# Patient Record
Sex: Female | Born: 1955 | Race: White | Hispanic: No | Smoking: Never smoker
Health system: Southern US, Community
[De-identification: ages and names within clinical notes are randomized; demographics above are authoritative.]

## PROBLEM LIST (undated history)

## (undated) DIAGNOSIS — K8020 Calculus of gallbladder without cholecystitis without obstruction: Secondary | ICD-10-CM

## (undated) DIAGNOSIS — R112 Nausea with vomiting, unspecified: Secondary | ICD-10-CM

## (undated) DIAGNOSIS — Z9889 Other specified postprocedural states: Secondary | ICD-10-CM

## (undated) DIAGNOSIS — G43909 Migraine, unspecified, not intractable, without status migrainosus: Secondary | ICD-10-CM

## (undated) DIAGNOSIS — N200 Calculus of kidney: Secondary | ICD-10-CM

## (undated) HISTORY — DX: Migraine, unspecified, not intractable, without status migrainosus: G43.909

## (undated) HISTORY — PX: ABDOMINAL HYSTERECTOMY: SHX81

## (undated) HISTORY — PX: APPENDECTOMY: SHX54

---

## 2003-03-04 ENCOUNTER — Encounter: Payer: Self-pay | Admitting: Internal Medicine

## 2003-03-04 ENCOUNTER — Emergency Department (HOSPITAL_COMMUNITY): Admission: EM | Admit: 2003-03-04 | Discharge: 2003-03-04 | Payer: Self-pay | Admitting: Internal Medicine

## 2003-04-29 ENCOUNTER — Encounter: Payer: Self-pay | Admitting: Family Medicine

## 2003-04-29 ENCOUNTER — Ambulatory Visit (HOSPITAL_COMMUNITY): Admission: RE | Admit: 2003-04-29 | Discharge: 2003-04-29 | Payer: Self-pay | Admitting: Family Medicine

## 2003-05-11 ENCOUNTER — Ambulatory Visit (HOSPITAL_COMMUNITY): Admission: RE | Admit: 2003-05-11 | Discharge: 2003-05-11 | Payer: Self-pay | Admitting: Family Medicine

## 2003-05-11 ENCOUNTER — Encounter: Payer: Self-pay | Admitting: Family Medicine

## 2003-05-27 ENCOUNTER — Other Ambulatory Visit: Admission: RE | Admit: 2003-05-27 | Discharge: 2003-05-27 | Payer: Self-pay | Admitting: Obstetrics and Gynecology

## 2003-08-22 ENCOUNTER — Ambulatory Visit (HOSPITAL_COMMUNITY): Admission: RE | Admit: 2003-08-22 | Discharge: 2003-08-22 | Payer: Self-pay | Admitting: Obstetrics & Gynecology

## 2003-08-22 ENCOUNTER — Encounter: Payer: Self-pay | Admitting: Obstetrics & Gynecology

## 2003-09-08 ENCOUNTER — Inpatient Hospital Stay (HOSPITAL_COMMUNITY): Admission: RE | Admit: 2003-09-08 | Discharge: 2003-09-13 | Payer: Self-pay | Admitting: Obstetrics & Gynecology

## 2003-09-10 ENCOUNTER — Encounter: Payer: Self-pay | Admitting: Obstetrics & Gynecology

## 2004-10-11 ENCOUNTER — Encounter (HOSPITAL_COMMUNITY): Admission: RE | Admit: 2004-10-11 | Discharge: 2004-11-10 | Payer: Self-pay | Admitting: Orthopaedic Surgery

## 2005-03-19 ENCOUNTER — Ambulatory Visit (HOSPITAL_COMMUNITY): Admission: RE | Admit: 2005-03-19 | Discharge: 2005-03-19 | Payer: Self-pay | Admitting: Obstetrics & Gynecology

## 2006-03-21 ENCOUNTER — Ambulatory Visit (HOSPITAL_COMMUNITY): Admission: RE | Admit: 2006-03-21 | Discharge: 2006-03-21 | Payer: Self-pay | Admitting: Obstetrics & Gynecology

## 2007-06-04 ENCOUNTER — Ambulatory Visit (HOSPITAL_COMMUNITY): Admission: RE | Admit: 2007-06-04 | Discharge: 2007-06-04 | Payer: Self-pay | Admitting: Family Medicine

## 2007-07-13 ENCOUNTER — Ambulatory Visit (HOSPITAL_COMMUNITY): Admission: RE | Admit: 2007-07-13 | Discharge: 2007-07-13 | Payer: Self-pay | Admitting: Internal Medicine

## 2007-07-13 ENCOUNTER — Ambulatory Visit: Payer: Self-pay | Admitting: Internal Medicine

## 2007-09-15 ENCOUNTER — Emergency Department (HOSPITAL_COMMUNITY): Admission: EM | Admit: 2007-09-15 | Discharge: 2007-09-15 | Payer: Self-pay | Admitting: Emergency Medicine

## 2007-10-06 ENCOUNTER — Emergency Department (HOSPITAL_COMMUNITY): Admission: EM | Admit: 2007-10-06 | Discharge: 2007-10-06 | Payer: Self-pay | Admitting: Emergency Medicine

## 2007-10-30 ENCOUNTER — Ambulatory Visit (HOSPITAL_COMMUNITY): Admission: RE | Admit: 2007-10-30 | Discharge: 2007-10-30 | Payer: Self-pay | Admitting: Family Medicine

## 2011-01-21 ENCOUNTER — Other Ambulatory Visit (HOSPITAL_COMMUNITY): Payer: Self-pay | Admitting: Family Medicine

## 2011-01-21 DIAGNOSIS — Z139 Encounter for screening, unspecified: Secondary | ICD-10-CM

## 2011-01-28 ENCOUNTER — Ambulatory Visit (HOSPITAL_COMMUNITY): Payer: Self-pay

## 2011-01-28 ENCOUNTER — Ambulatory Visit (HOSPITAL_COMMUNITY)
Admission: RE | Admit: 2011-01-28 | Discharge: 2011-01-28 | Disposition: A | Discharge status: Home / Self Care | Payer: Self-pay | Source: Ambulatory Visit | Attending: Family Medicine | Admitting: Family Medicine

## 2011-01-28 DIAGNOSIS — Z1231 Encounter for screening mammogram for malignant neoplasm of breast: Secondary | ICD-10-CM | POA: Insufficient documentation

## 2011-01-28 DIAGNOSIS — Z139 Encounter for screening, unspecified: Secondary | ICD-10-CM

## 2011-01-30 ENCOUNTER — Other Ambulatory Visit: Payer: Self-pay | Admitting: Family Medicine

## 2011-01-30 DIAGNOSIS — R928 Other abnormal and inconclusive findings on diagnostic imaging of breast: Secondary | ICD-10-CM

## 2011-02-06 ENCOUNTER — Ambulatory Visit (HOSPITAL_COMMUNITY)
Admission: RE | Admit: 2011-02-06 | Discharge: 2011-02-06 | Disposition: A | Discharge status: Home / Self Care | Payer: Self-pay | Source: Ambulatory Visit | Attending: Family Medicine | Admitting: Family Medicine

## 2011-02-06 ENCOUNTER — Other Ambulatory Visit: Payer: Self-pay

## 2011-02-06 DIAGNOSIS — N6009 Solitary cyst of unspecified breast: Secondary | ICD-10-CM | POA: Insufficient documentation

## 2011-02-06 DIAGNOSIS — R928 Other abnormal and inconclusive findings on diagnostic imaging of breast: Secondary | ICD-10-CM | POA: Insufficient documentation

## 2011-04-23 NOTE — Op Note (Signed)
NAME:  Andrea Miles, Andrea Miles               ACCOUNT NO.:  [id number]   MEDICAL RECORD NO.:  [id number]          PATIENT TYPE:  AMB   LOCATION:  DAY                           FACILITY:  [facility]   PHYSICIAN:  R. Roetta Sessions, M.D. DATE OF BIRTH:  1956-01-10   DATE OF PROCEDURE:  07/13/2007  DATE OF DISCHARGE:                               OPERATIVE REPORT   PROCEDURE:  Screening colonoscopy.   INDICATIONS FOR PROCEDURE:  55 year old lady referred by Dr. Lubertha South for colorectal cancer screening.  She has no lower GI tract  symptoms.  She has never had her lower GI tract imaged.  There is no  family history of colorectal neoplasia.  Colonoscopy is now being done.  This approach has been discussed with the patient at length.  The  potential risks, benefits, and alternatives have been reviewed and  questions answered.  She is agreeable.  Please see the documentation in  the medical record.   PROCEDURE:  O2 saturation, blood pressure, pulses, and respirations were  monitored throughout the entirety of the procedure.  Conscious sedation  was with Versed 5 mg IV, Demerol 125 mg IV in divided doses.  The  instrument was the Pentax video chip system.   FINDINGS:  Digital rectal examination revealed no abnormalities.   ENDOSCOPIC FINDINGS:  The prep was good.   Examination of the colonic mucosa was undertaken from the rectosigmoid  junction through the left, transverse, right colon to the area of the  appendiceal orifice, ileocecal valve, and cecum.  These structures were  well-seen and photographed for the record.  From this level, the scope  was slowly withdrawn.  All previously mentioned mucosal surfaces were  again seen.  The colonic mucosa appeared normal.  The scope was put down  into the rectum where a thorough examination of the rectal mucosa  including retroflex view of the anal verge demonstrates no  abnormalities.   THERAPEUTIC/DIAGNOSTIC MANEUVERS:  None.   The patient tolerated  the procedure well and was reactive in endoscopy.   IMPRESSION:  1. Normal rectum.  2. Normal colon.   RECOMMENDATIONS:  Repeat screening colonoscopy in 10 years.      Jonathon Bellows, M.D.  Electronically Signed     RMR/MEDQ  D:  07/13/2007  T:  07/13/2007  Job:  [id number]   cc:   Donna Bernard, M.D.  Fax: [phone]

## 2011-04-26 NOTE — Op Note (Signed)
NAME:  Andrea Miles, Andrea Miles                         ACCOUNT NO.:  [id number]   MEDICAL RECORD NO.:  [id number]                   PATIENT TYPE:  AMB   LOCATION:  DAY                                  FACILITY:  [facility]   PHYSICIAN:  Lazaro Arms, M.D.                DATE OF BIRTH:  10/17/1956   DATE OF PROCEDURE:  09/08/2003  DATE OF DISCHARGE:                                 OPERATIVE REPORT   PREOPERATIVE DIAGNOSES:  1. Hypomenorrhea.  2. Dysmenorrhea.  3. Dyspareunia.  4. Anemia.  5. Chronic pelvic pain.   POSTOPERATIVE DIAGNOSES:  1. Hypomenorrhea.  2. Dysmenorrhea.  3. Dyspareunia.  4. Anemia.  5. Chronic pelvic pain.   OPERATION/PROCEDURE:  Total abdominal hysterectomy/bilateral salpingo-  oophorectomy.   SURGEON:  Lazaro Arms, M.D.   ANESTHESIA:  General endotracheal anesthesia.   FINDINGS:  The patient had globular enlarged uterus.  There were no  adhesions.  There was no endometriosis.  No discernable fibroids, probably  consistent with adenomyosis.   DESCRIPTION OF PROCEDURE:  The patient was taken to the operating room and  placed in the supine position where she underwent general endotracheal  anesthesia.  The vagina was prepped, the Foley catheter was placed.  The  abdomen was prepped and draped in the usual sterile fashion.   A Pfannenstiel skin incision was made and carried down sharply to the rectus  fascia which was scored in the midline and extended laterally.  The fascia  was taken off the muscle superiorly and inferiorly and the muscles were  divided.  The peritoneal cavity was entered.  A Protractor self-retaining  retractor was placed.  The uterine cornua were grasped.  The  infundibulopelvic ligaments were isolated, an avascular window made.  They  were clamped, cut and doubly suture ligated.  The round ligaments were  suture ligated and cut bilaterally as well.  The uterine vessels were  skeletonized.  The vesicouterine serosa flap was created and  the bladder was  pushed off the lower uterine segment without difficulty.  The uterine  vessels were clamped, cut and suture ligated without difficulty.  Serial  pedicles were taken down the cervix, each pedicle being clamped, cut,  transfixed and suture ligated.  The vaginal angles were crossclamped.  Specimen was removed.  Vaginal sutures were placed and vagina was closed  with interrupted figure-of-eight sutures.  There was good hemostasis.  Pelvis was irrigated vigorously.  All pedicles were found to be hemostatic.  Lap packs and retractors were removed.  All counts were correct.  The  muscles were reapproximated loosely.  The fascia closed using 0 Vicryl  running.  Subcutaneous tissue was made hemostatic and irrigated.  The skin  was closed using skin staples.  The patient tolerated the procedure well.  She experienced 150 mL of blood loss and was taken to the recovery room in  good and stable condition.  All  counts were correct x3.  She received Ancef  prophylactically.       ___________________________________________                                            Lazaro Arms, M.D.   Loraine Maple  D:  09/08/2003  T:  09/08/2003  Job:  [id number]

## 2011-04-26 NOTE — H&P (Signed)
   NAME:  Andrea Miles, Andrea Miles                         ACCOUNT NO.:  [id number]   MEDICAL RECORD NO.:  [id number]                   PATIENT TYPE:  AMB   LOCATION:  DAY                                  FACILITY:  [facility]   PHYSICIAN:  Lazaro Arms, M.D.                DATE OF BIRTH:  29-May-1956   DATE OF ADMISSION:  DATE OF DISCHARGE:                                HISTORY & PHYSICAL   HISTORY OF PRESENT ILLNESS:  Andrea Miles is a 55 year old white female, gravida 3,  para 2, abortus 1, with last menstrual period of June 25, 2003, who has been  maintained on Megace to prevent her from having any more cycles. The patient  has very heavy menstrual cycles and extremely crampy. She has pain with  intercourse in both the midline and both adnexal areas with each time. Her  hemoglobin in the office was 10.5 despite daily iron therapy. As a result,  she is requesting definitively therapy. She at one time was talking about  discussing the possibility of being pregnant in the future, but she has now  dismissed that. As a result, we are proceeding with a TAH/BSO.   PAST MEDICAL HISTORY:  Negative.   PAST SURGICAL HISTORY:  Appendectomy in 1966, tubal ligation in 1981,  reversal in 1986.   ALLERGIES:  CODEINE, SULFA, VICODIN, MOTRIN, LODINE, DARVOCET, PERCOCET, and  ELAVIL.   MEDICATIONS:  The only medication she currently takes is Xanax and Paxil.   REVIEW OF SYSTEMS:  Negative. She does not smoke, drink, or do drugs  currently.   PHYSICAL EXAMINATION:  HEENT:  Unremarkable.  NECK:  Thyroid was normal.  LUNGS:  Lungs were clear.  HEART:  Heart is regular rate and rhythm without gallop.  BREASTS:  Breasts without masses, discharge, or skin changes.  ABDOMEN:  Abdomen is benign. No hepatosplenomegaly or masses.  GU:  She has normal external genitalia. Vagina is  __________ without  discharge.  Cervix is parous without  lesions. Uterus normal shape and  contour. Ovaries normal and nontender.   IMPRESSION:  1. Hypermenorrhea.  2. Dysmenorrhea.  3. Dyspareunia.    PLAN:  The patient is admitted for TAH/BSO. She understands the risks,  benefits, indications, and alternatives and will proceed.    ___________________________________________                                         Lazaro Arms, M.D.   Andrea Miles  D:  09/07/2003  T:  09/07/2003  Job:  [id number]

## 2013-08-08 ENCOUNTER — Encounter (HOSPITAL_COMMUNITY): Payer: Self-pay | Admitting: *Deleted

## 2013-08-08 ENCOUNTER — Emergency Department (HOSPITAL_COMMUNITY)
Admission: EM | Admit: 2013-08-08 | Discharge: 2013-08-08 | Disposition: A | Discharge status: Home / Self Care | Payer: Self-pay | Attending: Emergency Medicine | Admitting: Emergency Medicine

## 2013-08-08 DIAGNOSIS — Z8719 Personal history of other diseases of the digestive system: Secondary | ICD-10-CM | POA: Insufficient documentation

## 2013-08-08 DIAGNOSIS — R1011 Right upper quadrant pain: Secondary | ICD-10-CM | POA: Insufficient documentation

## 2013-08-08 DIAGNOSIS — Z87442 Personal history of urinary calculi: Secondary | ICD-10-CM | POA: Insufficient documentation

## 2013-08-08 DIAGNOSIS — R109 Unspecified abdominal pain: Secondary | ICD-10-CM

## 2013-08-08 HISTORY — DX: Calculus of kidney: N20.0

## 2013-08-08 HISTORY — DX: Calculus of gallbladder without cholecystitis without obstruction: K80.20

## 2013-08-08 LAB — CBC WITH DIFFERENTIAL/PLATELET
Basophils Relative: 0 % (ref 0–1)
Lymphocytes Relative: 17 % (ref 12–46)
Lymphs Abs: 2.2 10*3/uL (ref 0.7–4.0)
Neutrophils Relative %: 78 % — ABNORMAL HIGH (ref 43–77)
RDW: 12.3 % (ref 11.5–15.5)

## 2013-08-08 LAB — COMPREHENSIVE METABOLIC PANEL
Albumin: 4.1 g/dL (ref 3.5–5.2)
CO2: 26 mEq/L (ref 19–32)
Chloride: 99 mEq/L (ref 96–112)
Creatinine, Ser: 0.81 mg/dL (ref 0.50–1.10)
GFR calc Af Amer: >90 mL/min (ref >90)
GFR calc non Af Amer: 79 mL/min — ABNORMAL LOW (ref >90)
Glucose, Bld: 96 mg/dL (ref 70–99)
Potassium: 3.7 mEq/L (ref 3.5–5.1)
Sodium: 135 mEq/L (ref 135–145)
Total Bilirubin: 0.4 mg/dL (ref 0.3–1.2)
Total Protein: 7.9 g/dL (ref 6.0–8.3)

## 2013-08-08 LAB — LIPASE, BLOOD: Lipase: 23 U/L (ref 11–59)

## 2013-08-08 MED ORDER — PROMETHAZINE HCL 25 MG PO TABS: 25.0000 mg | ORAL_TABLET | Freq: Four times a day (QID) | ORAL | Status: DC | PRN

## 2013-08-08 MED ORDER — TRAMADOL HCL 50 MG PO TABS: 50.0000 mg | ORAL_TABLET | Freq: Four times a day (QID) | ORAL | Status: DC | PRN

## 2013-08-08 MED ORDER — MORPHINE SULFATE 4 MG/ML IJ SOLN
4.0000 mg | Freq: Once | INTRAMUSCULAR | Status: AC
  Administered 2013-08-08: 4 mg via INTRAVENOUS
  Filled 2013-08-08: qty 1

## 2013-08-08 MED ORDER — ONDANSETRON HCL 4 MG/2ML IJ SOLN
4.0000 mg | Freq: Once | INTRAMUSCULAR | Status: AC
  Administered 2013-08-08: 4 mg via INTRAVENOUS
  Filled 2013-08-08: qty 2

## 2013-08-08 MED ORDER — SODIUM CHLORIDE 0.9 % IV BOLUS (SEPSIS)
1000.0000 mL | Freq: Once | INTRAVENOUS | Status: AC
  Administered 2013-08-08: 1000 mL via INTRAVENOUS

## 2013-08-08 NOTE — ED Notes (Signed)
Pt co pain 8/10

## 2013-08-08 NOTE — ED Notes (Signed)
Pt c/o right upper quad pain that radiates around to back area, has hx of gallstones, denies any n/v,

## 2013-08-08 NOTE — ED Notes (Signed)
Pt alert & oriented x4, stable gait. Patient given discharge instructions, paperwork & prescription(s). Patient  instructed to stop at the registration desk to finish any additional paperwork. Patient verbalized understanding. Pt left department w/ no further questions. 

## 2013-08-08 NOTE — ED Notes (Signed)
Pt medicated for pain 8/10

## 2013-08-08 NOTE — ED Notes (Signed)
Per Dr. Adriana Simas - patient given a Sprite.

## 2013-08-08 NOTE — ED Provider Notes (Signed)
CSN: [id number]     Arrival date & time 08/08/13  1117 History  This chart was scribed for Andrea Hutching, MD by Caryn Bee, ED Scribe and Greggory Stallion, ED Scribe. This patient was seen in room APA03/APA03 and the patient's care was started at 1:09 PM.    Chief Complaint  Patient presents with  . Abdominal Pain    The history is provided by the patient. No language interpreter was used.   HPI Comments: Andrea Miles is a 57 y.o. female who presents to the Emergency Department complaining of RUQ abdominal pain that radiates to the right side of her back that began at 10:00 PM last night. Pt had and ultrasound about 4 or 5 years ago done at [facility] and was diagnosed with gallstones. Pt has tried tylenol but has had no relief. Pt has no appetite. No nausea, vomiting, diarrhea  PCP: Dr. Lilyan Punt    Past Medical History  Diagnosis Date  . Gallstones   . Kidney stones    Past Surgical History  Procedure Laterality Date  . Abdominal hysterectomy    . Appendectomy     No family history on file. History  Substance Use Topics  . Smoking status: Never Smoker   . Smokeless tobacco: Not on file  . Alcohol Use: No   OB History   Grav Para Term Preterm Abortions TAB SAB Ect Mult Living                 Review of Systems  All other systems reviewed and are negative.   A complete 10 system review of systems was obtained and all systems are negative except as noted in the HPI and PMH.    Allergies  Amitriptyline; Codeine; Darvocet; Oxycodone; Sulfa antibiotics; and Toradol  Home Medications  No current outpatient prescriptions on file. BP 148/86  Pulse 88  Temp(Src) 98.5 F (36.9 C)  Resp 18  Ht 5\' 7"  (1.702 m)  Wt 170 lb (77.111 kg)  BMI 26.62 kg/m2  SpO2 98% Physical Exam  Nursing note and vitals reviewed. Constitutional: She is oriented to person, place, and time. She appears well-developed and well-nourished.  HENT:  Head: Normocephalic and atraumatic.   Eyes: Conjunctivae and EOM are normal. Pupils are equal, round, and reactive to light.  Neck: Normal range of motion. Neck supple.  Cardiovascular: Normal rate, regular rhythm and normal heart sounds.   Pulmonary/Chest: Effort normal and breath sounds normal.  Abdominal: Soft. Bowel sounds are normal. There is tenderness.  Tenderness to RUQ  Musculoskeletal: Normal range of motion.  Neurological: She is alert and oriented to person, place, and time.  Skin: Skin is warm and dry.  Psychiatric: She has a normal mood and affect.    ED Course  Procedures (including critical care time) DIAGNOSTIC STUDIES: Oxygen Saturation is 98% on room air, normal by my interpretation.    COORDINATION OF CARE: 1:54 PM-Discussed treatment plan which includes pain medications, blood tests, and outpatient ultrasound with pt at bedside and pt agreed to plan.     Labs Review Labs Reviewed  COMPREHENSIVE METABOLIC PANEL - Abnormal; Notable for the following:    GFR calc non Af Amer 79 (*)    All other components within normal limits  CBC WITH DIFFERENTIAL - Abnormal; Notable for the following:    WBC 13.6 (*)    Neutrophils Relative % 78 (*)    Neutro Abs 10.6 (*)    All other components within normal limits  LIPASE,  BLOOD   Imaging Review No results found.  MDM  No diagnosis found. No acute abdomen. White count noted to be slightly elevated. Liver enzymes negative. Gallbladder ultrasound scheduled for tomorrow. Discharge meds Ultram and Phenergan 25 mg     I personally performed the services described in this documentation, which was scribed in my presence. The recorded information has been reviewed and is accurate.    Andrea Hutching, MD 08/08/13 1745

## 2013-08-09 ENCOUNTER — Ambulatory Visit (HOSPITAL_COMMUNITY)
Admit: 2013-08-09 | Discharge: 2013-08-09 | Disposition: A | Discharge status: Home / Self Care | Payer: Self-pay | Attending: Emergency Medicine | Admitting: Emergency Medicine

## 2013-08-09 DIAGNOSIS — R1011 Right upper quadrant pain: Secondary | ICD-10-CM | POA: Insufficient documentation

## 2013-08-09 DIAGNOSIS — K7689 Other specified diseases of liver: Secondary | ICD-10-CM | POA: Insufficient documentation

## 2013-08-09 DIAGNOSIS — K8020 Calculus of gallbladder without cholecystitis without obstruction: Secondary | ICD-10-CM | POA: Insufficient documentation

## 2014-07-11 ENCOUNTER — Encounter: Payer: Self-pay | Admitting: Nurse Practitioner

## 2014-07-27 ENCOUNTER — Ambulatory Visit (INDEPENDENT_AMBULATORY_CARE_PROVIDER_SITE_OTHER): Payer: Self-pay | Admitting: Nurse Practitioner

## 2014-07-27 ENCOUNTER — Encounter: Payer: Self-pay | Admitting: Nurse Practitioner

## 2014-07-27 ENCOUNTER — Other Ambulatory Visit: Payer: Self-pay | Admitting: Nurse Practitioner

## 2014-07-27 VITALS — BP 134/86 | Temp 98.6°F | Ht 68.0 in | Wt 209.0 lb

## 2014-07-27 DIAGNOSIS — Z01419 Encounter for gynecological examination (general) (routine) without abnormal findings: Secondary | ICD-10-CM

## 2014-07-27 DIAGNOSIS — Z0000 Encounter for general adult medical examination without abnormal findings: Secondary | ICD-10-CM

## 2014-07-27 DIAGNOSIS — E2839 Other primary ovarian failure: Secondary | ICD-10-CM

## 2014-07-27 DIAGNOSIS — R300 Dysuria: Secondary | ICD-10-CM

## 2014-07-27 DIAGNOSIS — Z1231 Encounter for screening mammogram for malignant neoplasm of breast: Secondary | ICD-10-CM

## 2014-07-27 LAB — POCT URINALYSIS DIPSTICK
Spec Grav, UA: 1.015
pH, UA: 6

## 2014-07-27 MED ORDER — ESTRADIOL 1 MG PO TABS: 1.0000 mg | ORAL_TABLET | Freq: Every day | ORAL | Status: DC

## 2014-07-27 MED ORDER — HYDROXYZINE HCL 50 MG PO TABS: 50.0000 mg | ORAL_TABLET | Freq: Three times a day (TID) | ORAL | Status: DC | PRN

## 2014-07-27 MED ORDER — PHENTERMINE HCL 37.5 MG PO TABS
37.5000 mg | ORAL_TABLET | Freq: Every day | ORAL | Status: DC
Start: 2014-07-27 — End: 2014-10-28

## 2014-07-29 ENCOUNTER — Encounter: Payer: Self-pay | Admitting: Nurse Practitioner

## 2014-07-29 DIAGNOSIS — E2839 Other primary ovarian failure: Secondary | ICD-10-CM | POA: Insufficient documentation

## 2014-07-29 LAB — URINE CULTURE
COLONY COUNT: NO GROWTH
Organism ID, Bacteria: NO GROWTH

## 2014-07-29 NOTE — Progress Notes (Signed)
Subjective:    Patient ID: Andrea Miles, female    DOB: 15-Jul-1956, 58 y.o.   MRN: [id number]  Urinary Tract Infection  Pertinent negatives include no frequency, nausea, urgency or vomiting.   presents for her wellness physical. Has had a hysterectomy and bilateral oophorectomy. External burning with urination. Minimal urgency and frequency. No vaginal discharge. No fever. No pelvic pain. Is with the same partner but has not had intercourse in about 6-7 years. Would like to restart her phentermine, has been off this for several years. Has taken it without difficulty in the past. Complaints of significant frequent hot flashes, no relief with OTC or natural supplements. Healthy diet. Active. Nonsmoker. No personal history of blood clots. Regular vision and dental exams.    Review of Systems  Constitutional: Negative for fever, activity change, appetite change and fatigue.  HENT: Positive for rhinorrhea. Negative for dental problem, ear pain, sinus pressure and sore throat.   Respiratory: Negative for cough, chest tightness, shortness of breath and wheezing.   Cardiovascular: Negative for chest pain and leg swelling.  Gastrointestinal: Negative for nausea, vomiting, abdominal pain, diarrhea, constipation, blood in stool and abdominal distention.  Genitourinary: Positive for dysuria. Negative for urgency, frequency, vaginal discharge, enuresis, difficulty urinating, genital sores and pelvic pain.       Objective:   Physical Exam  Constitutional: She is oriented to person, place, and time. She appears well-developed. No distress.  HENT:  Right Ear: External ear normal.  Left Ear: External ear normal.  Mouth/Throat: Oropharynx is clear and moist.  Neck: Normal range of motion. Neck supple. No tracheal deviation present. No thyromegaly present.  Cardiovascular: Normal rate, regular rhythm and normal heart sounds.  Exam reveals no gallop.   No murmur heard. Pulmonary/Chest: Effort normal  and breath sounds normal.  Abdominal: Soft. She exhibits no distension. There is no tenderness.  Genitourinary: No vaginal discharge found.  External GU: No rashes or lesions, irritation. Skin is pale and dry. Vagina tissue is pale. No discharge. Rectal exam no masses noted, no stool for Hemoccult.  Musculoskeletal: She exhibits no edema.  Lymphadenopathy:    She has no cervical adenopathy.  Neurological: She is alert and oriented to person, place, and time.  Skin: Skin is warm and dry. No rash noted.  Psychiatric: She has a normal mood and affect. Her behavior is normal.   breast exam: No masses noted. Axilla no adenopathy. UA negative.       Assessment & Plan:  Well woman exam - Plan: MM DIGITAL SCREENING BILATERAL  Dysuria - Plan: POCT urinalysis dipstick, Urine culture  Hypoestrogenism  Morbid obesity  Meds ordered this encounter  Medications  . hydrOXYzine (ATARAX/VISTARIL) 50 MG tablet    Sig: Take 1 tablet (50 mg total) by mouth 3 (three) times daily as needed for itching.    Dispense:  90 tablet    Refill:  1    Order Specific Question:  Supervising Provider    Answer:  Mikey Kirschner [[id number]]  . phentermine (ADIPEX-P) 37.5 MG tablet    Sig: Take 1 tablet (37.5 mg total) by mouth daily before breakfast.    Dispense:  30 tablet    Refill:  2    Order Specific Question:  Supervising Provider    Answer:  Mikey Kirschner [[id number]]  . estradiol (ESTRACE) 1 MG tablet    Sig: Take 1 tablet (1 mg total) by mouth daily.    Dispense:  90 tablet    Refill:  1    Order Specific Question:  Supervising Provider    Answer:  Maggie Font   Encourage regular activity, healthy diet and daily vitamin D and calcium supplementation. Recheck in 3 months if she wishes to continue phentermine. Discussed risk associated with estrogen use at length. Patient is uninsured, plan routine lab work at next visit. Also recommend tetanus booster. Next physical in one year.

## 2014-07-30 ENCOUNTER — Encounter: Payer: Self-pay | Admitting: *Deleted

## 2014-08-01 ENCOUNTER — Ambulatory Visit (HOSPITAL_COMMUNITY): Payer: Self-pay

## 2014-08-03 ENCOUNTER — Ambulatory Visit (HOSPITAL_COMMUNITY)
Admission: RE | Admit: 2014-08-03 | Discharge: 2014-08-03 | Disposition: A | Discharge status: Home / Self Care | Payer: Self-pay | Source: Ambulatory Visit | Attending: Nurse Practitioner | Admitting: Nurse Practitioner

## 2014-08-03 DIAGNOSIS — Z1231 Encounter for screening mammogram for malignant neoplasm of breast: Secondary | ICD-10-CM | POA: Insufficient documentation

## 2014-10-28 ENCOUNTER — Ambulatory Visit (INDEPENDENT_AMBULATORY_CARE_PROVIDER_SITE_OTHER): Payer: Self-pay | Admitting: Nurse Practitioner

## 2014-10-28 ENCOUNTER — Encounter: Payer: Self-pay | Admitting: Nurse Practitioner

## 2014-10-28 DIAGNOSIS — E2839 Other primary ovarian failure: Secondary | ICD-10-CM

## 2014-10-28 MED ORDER — PHENTERMINE HCL 37.5 MG PO TABS: 37.5000 mg | ORAL_TABLET | Freq: Every day | ORAL | Status: DC

## 2014-10-28 MED ORDER — ESTRADIOL 1 MG PO TABS: 1.0000 mg | ORAL_TABLET | Freq: Every day | ORAL | Status: DC

## 2014-11-01 ENCOUNTER — Encounter: Payer: Self-pay | Admitting: Nurse Practitioner

## 2014-11-01 NOTE — Progress Notes (Signed)
Subjective:  Presents for recheck. Active. Healthy diet. No side effects with phentermine. Also wants to continue Estrace. Understands risks associated with HRT.   Objective:   BP 130/84 mmHg  Ht 5\' 8"  (1.727 m)  Wt 196 lb 8 oz (89.132 kg)  BMI 29.88 kg/m2 NAD. Alert, oriented. Lungs clear. Heart RRR.   Assessment:  Problem List Items Addressed This Visit      Other   Morbid obesity - Primary   Relevant Medications      phentermine (ADIPEX-P) 37.5 MG tablet   Hypoestrogenism     Plan:  Meds ordered this encounter  Medications  . phentermine (ADIPEX-P) 37.5 MG tablet    Sig: Take 1 tablet (37.5 mg total) by mouth daily before breakfast.    Dispense:  30 tablet    Refill:  2    Order Specific Question:  Supervising Provider    Answer:  Mikey Kirschner [[id number]]  . estradiol (ESTRACE) 1 MG tablet    Sig: Take 1 tablet (1 mg total) by mouth daily.    Dispense:  90 tablet    Refill:  1    Order Specific Question:  Supervising Provider    Answer:  Maggie Font   Will need to stop Phentermine after this prescription. Again reviewed risks associated with HRT. Recheck here as needed.

## 2015-01-30 ENCOUNTER — Other Ambulatory Visit: Payer: Self-pay | Admitting: Nurse Practitioner

## 2015-01-30 MED ORDER — ESTRADIOL 1 MG PO TABS: 1.0000 mg | ORAL_TABLET | Freq: Every day | ORAL | Status: DC

## 2016-03-18 ENCOUNTER — Other Ambulatory Visit: Payer: Self-pay | Admitting: Nurse Practitioner

## 2016-03-18 MED ORDER — DIAZEPAM 10 MG PO TABS: 10.0000 mg | ORAL_TABLET | Freq: Every evening | ORAL | Status: DC | PRN

## 2016-03-18 NOTE — Progress Notes (Signed)
Patient's mother died yesterday after prolonged illness and hospice care. Not able to sleep. Given one Rx for valium which has worked in the past.

## 2017-06-09 ENCOUNTER — Telehealth: Payer: Self-pay

## 2017-06-09 NOTE — Telephone Encounter (Signed)
Letter mailed to pt.  

## 2017-06-09 NOTE — Telephone Encounter (Signed)
RECALL FOR TCS °

## 2018-06-25 ENCOUNTER — Encounter: Payer: Self-pay | Admitting: Nurse Practitioner

## 2018-06-25 ENCOUNTER — Ambulatory Visit (INDEPENDENT_AMBULATORY_CARE_PROVIDER_SITE_OTHER): Payer: Self-pay | Admitting: Nurse Practitioner

## 2018-06-25 VITALS — BP 138/86 | Ht 68.0 in | Wt 211.2 lb

## 2018-06-25 DIAGNOSIS — Z01419 Encounter for gynecological examination (general) (routine) without abnormal findings: Secondary | ICD-10-CM

## 2018-06-25 DIAGNOSIS — F5104 Psychophysiologic insomnia: Secondary | ICD-10-CM

## 2018-06-25 MED ORDER — HYDROXYZINE HCL 50 MG PO TABS: 50.0000 mg | ORAL_TABLET | Freq: Three times a day (TID) | ORAL | 1 refills | Status: DC | PRN

## 2018-06-25 MED ORDER — DIAZEPAM 10 MG PO TABS: 10.0000 mg | ORAL_TABLET | Freq: Every evening | ORAL | 1 refills | Status: DC | PRN

## 2018-06-25 NOTE — Patient Instructions (Addendum)
[facility] [phone]

## 2018-06-27 ENCOUNTER — Encounter: Payer: Self-pay | Admitting: Nurse Practitioner

## 2018-06-27 DIAGNOSIS — F5104 Psychophysiologic insomnia: Secondary | ICD-10-CM | POA: Insufficient documentation

## 2018-06-27 NOTE — Progress Notes (Signed)
Subjective:    Patient ID: Andrea Miles, female    DOB: [birth year], 62 y.o.   MRN: [id number]  HPI presents for her wellness exam. BP outside of office running about 130/80. Has had a total hysterectomy. Same sexual partner. Has eye exam scheduled. Regular dental care. Takes valium 10 mg about every 3 nights for sleep. Regular walking for activity. Defers labs and colonoscopy due to lack of insurance.     Review of Systems  Constitutional: Negative for activity change, appetite change and fatigue.  HENT: Negative for dental problem, ear pain, sinus pressure and sore throat.   Respiratory: Negative for cough, chest tightness, shortness of breath and wheezing.   Cardiovascular: Negative for chest pain.  Gastrointestinal: Negative for abdominal distention, abdominal pain, blood in stool, constipation, diarrhea, nausea and vomiting.  Genitourinary: Negative for difficulty urinating, dysuria, enuresis, frequency, genital sores, pelvic pain, urgency and vaginal discharge.   Depression screen PHQ 2/9 06/25/2018  Decreased Interest 0  Down, Depressed, Hopeless 0  PHQ - 2 Score 0   GAD 7 : Generalized Anxiety Score 06/25/2018  Nervous, Anxious, on Edge 3  Control/stop worrying 3  Worry too much - different things 3  Trouble relaxing 3  Restless 2  Easily annoyed or irritable 3  Afraid - awful might happen 1  Total GAD 7 Score 18  Anxiety Difficulty Somewhat difficult    Her boyfriend has multiple health issues which she states has caused her anxiety and stress.      Objective:   Physical Exam  Constitutional: She is oriented to person, place, and time. She appears well-developed. No distress.  HENT:  Right Ear: External ear normal.  Left Ear: External ear normal.  Mouth/Throat: Oropharynx is clear and moist.  Neck: Normal range of motion. Neck supple. No tracheal deviation present. No thyromegaly present.  Cardiovascular: Normal rate, regular rhythm and normal heart sounds. Exam  reveals no gallop.  No murmur heard. Pulmonary/Chest: Effort normal and breath sounds normal. Right breast exhibits no inverted nipple, no mass, no skin change and no tenderness. Left breast exhibits no inverted nipple, no mass, no skin change and no tenderness. Breasts are symmetrical.  Axillae no adenopathy.   Abdominal: Soft. She exhibits no distension. There is no tenderness.  Genitourinary: Vagina normal. No vaginal discharge found.  Genitourinary Comments: External GU: no rashes or lesions. Vagina: no discharge. Bimanual exam: no tenderness or obvious masses.   Musculoskeletal: She exhibits no edema.  Lymphadenopathy:    She has no cervical adenopathy.  Neurological: She is alert and oriented to person, place, and time.  Skin: Skin is warm and dry. No rash noted.  Psychiatric: She has a normal mood and affect. Her behavior is normal.          Assessment & Plan:   Problem List Items Addressed This Visit      Other   Psychophysiological insomnia    Other Visit Diagnoses    Well woman exam    -  Primary     Meds ordered this encounter  Medications  . hydrOXYzine (ATARAX/VISTARIL) 50 MG tablet    Sig: Take 1 tablet (50 mg total) by mouth 3 (three) times daily as needed for itching.    Dispense:  90 tablet    Refill:  1    Order Specific Question:   Supervising Provider    Answer:   Mikey Kirschner [[id number]]  . diazepam (VALIUM) 10 MG tablet    Sig: Take 1 tablet (  10 mg total) by mouth at bedtime as needed for sleep.    Dispense:  30 tablet    Refill:  1    Order Specific Question:   Supervising Provider    Answer:   Mikey Kirschner [[id number]]   Given refill on hydroxyzine. Take valium no more than every 3 days for sleep. Defers daily medicine at this point for anxiety.  Given Rx to get mammogram at [facility] for $99. Call back if she wishes further treatment for anxiety.  Return in about 1 year (around 06/26/2019) for physical.

## 2018-11-23 ENCOUNTER — Telehealth: Payer: Self-pay | Admitting: Family Medicine

## 2018-11-23 NOTE — Telephone Encounter (Signed)
Pt has been summoned for Juror Duty and is needing a note to dismiss her from having to attend due to her being POA and caregiver for mutual pt of [place],Michael dob: [birth year]. Advise.

## 2018-11-23 NOTE — Telephone Encounter (Signed)
Please advise. Thank you

## 2018-11-26 ENCOUNTER — Encounter: Payer: Self-pay | Admitting: Family Medicine

## 2018-11-26 NOTE — Telephone Encounter (Signed)
Mailed out letter.

## 2018-11-26 NOTE — Telephone Encounter (Signed)
Letter was dictated thank you please forward to the patient thank you

## 2019-02-17 ENCOUNTER — Telehealth: Payer: Self-pay | Admitting: Family Medicine

## 2019-02-17 MED ORDER — DIAZEPAM 10 MG PO TABS: 10.0000 mg | ORAL_TABLET | Freq: Every evening | ORAL | 3 refills | Status: DC | PRN

## 2019-02-17 NOTE — Telephone Encounter (Signed)
Script printed and awaiting signature. Will call to pharmacy when signed. Pt contacted and verbalized understanding.

## 2019-02-17 NOTE — Telephone Encounter (Signed)
Has a physical scheduled for 06/28/19 and would like a refill on Diazepam to last until them.  [facility] in [place]

## 2019-02-17 NOTE — Telephone Encounter (Signed)
May have 30 with 3 refills

## 2019-02-17 NOTE — Telephone Encounter (Signed)
Please advise. Thank you

## 2019-06-28 ENCOUNTER — Encounter: Payer: Self-pay | Admitting: Family Medicine

## 2019-06-28 ENCOUNTER — Other Ambulatory Visit: Payer: Self-pay

## 2019-06-28 ENCOUNTER — Ambulatory Visit (INDEPENDENT_AMBULATORY_CARE_PROVIDER_SITE_OTHER): Payer: Self-pay | Admitting: Family Medicine

## 2019-06-28 VITALS — BP 108/64 | Temp 97.0°F | Ht 68.0 in | Wt 207.2 lb

## 2019-06-28 DIAGNOSIS — Z01419 Encounter for gynecological examination (general) (routine) without abnormal findings: Secondary | ICD-10-CM

## 2019-06-28 MED ORDER — DIAZEPAM 10 MG PO TABS: 10.0000 mg | ORAL_TABLET | Freq: Every evening | ORAL | 5 refills | Status: DC | PRN

## 2019-06-28 MED ORDER — HYDROXYZINE HCL 50 MG PO TABS: 50.0000 mg | ORAL_TABLET | Freq: Three times a day (TID) | ORAL | 3 refills | Status: DC | PRN

## 2019-06-28 NOTE — Progress Notes (Signed)
Subjective:    Patient ID: Andrea Miles, female    DOB: 02-17-1956, 63 y.o.   MRN: [id number]  HPI  The patient comes in today for a wellness visit. Wellness exam Patient defers on any extensive testing including mammograms and colonoscopies because of finances I told her that [place] does have some available help for some individuals regarding this for now she wants to defer on colonoscopy she will check into the mammogram she will do cholesterol profile as long as the cost of the labs not too much  She does have insomnia issues Patient suffers with insomnia.  This is been going on for a while.  The patient finds it necessary to use medication to help sleep.  Patient finds it if not using medication has significant troubles.  Denies abusing the medication.  Denies any negative side effects. She does try to stay active in her yard does try to eat healthy and is trying to take proper precautions against COVID   A review of their health history was completed.  A review of medications was also completed.  Any needed refills; yes  Eating habits: doesn't eat pork or beef  Falls/  MVA accidents in past few months: none  Regular exercise: walking in yard and gardening  Specialist pt sees on regular basis: none  Preventative health issues were discussed.   Additional concerns: under a lot of stress   Review of Systems  Constitutional: Negative for activity change, appetite change and fatigue.  HENT: Negative for congestion and rhinorrhea.   Eyes: Negative for discharge.  Respiratory: Negative for cough, chest tightness and wheezing.   Cardiovascular: Negative for chest pain.  Gastrointestinal: Negative for abdominal pain, blood in stool and vomiting.  Endocrine: Negative for polyphagia.  Genitourinary: Negative for difficulty urinating and frequency.  Musculoskeletal: Negative for neck pain.  Skin: Negative for color change.  Allergic/Immunologic: Negative for  environmental allergies and food allergies.  Neurological: Negative for weakness and headaches.  Psychiatric/Behavioral: Negative for agitation and behavioral problems.       Objective:   Physical Exam Constitutional:      Appearance: She is well-developed.  HENT:     Head: Normocephalic.     Right Ear: External ear normal.     Left Ear: External ear normal.  Eyes:     Pupils: Pupils are equal, round, and reactive to light.  Neck:     Musculoskeletal: Normal range of motion.     Thyroid: No thyromegaly.  Cardiovascular:     Rate and Rhythm: Normal rate and regular rhythm.     Heart sounds: Normal heart sounds. No murmur.  Pulmonary:     Effort: Pulmonary effort is normal. No respiratory distress.     Breath sounds: Normal breath sounds. No wheezing.  Abdominal:     General: Bowel sounds are normal. There is no distension.     Palpations: Abdomen is soft. There is no mass.     Tenderness: There is no abdominal tenderness.  Musculoskeletal: Normal range of motion.        General: No tenderness.  Lymphadenopathy:     Cervical: No cervical adenopathy.  Skin:    General: Skin is warm and dry.  Neurological:     Mental Status: She is alert and oriented to person, place, and time.     Motor: No abnormal muscle tone.  Psychiatric:        Behavior: Behavior normal.    Breast exam normal Pap smear not  done Gynecologic exam deferred       Assessment & Plan:  Adult wellness-complete.wellness physical was conducted today. Importance of diet and exercise were discussed in detail.  In addition to this a discussion regarding safety was also covered. We also reviewed over immunizations and gave recommendations regarding current immunization needed for age.  In addition to this additional areas were also touched on including: Preventative health exams needed:  Colonoscopy she defers currently she plans to get this next June when she has insurance I did offer her stool testing  for blood she defers She will do a follow-up in 1 year for a wellness She defers on mammogram  Patient was advised yearly wellness exam   As for her insomnia I did renew her medications Also renewed her hydroxyzine for itching Also stated to her that for any ongoing refills to connect with [place] we will be happy to do so to try to minimize her cost she will follow-up in 1 year

## 2020-06-16 ENCOUNTER — Ambulatory Visit (INDEPENDENT_AMBULATORY_CARE_PROVIDER_SITE_OTHER): Payer: Medicare Other | Admitting: Family Medicine

## 2020-06-16 ENCOUNTER — Encounter: Payer: Self-pay | Admitting: Family Medicine

## 2020-06-16 ENCOUNTER — Other Ambulatory Visit: Payer: Self-pay

## 2020-06-16 VITALS — Temp 97.4°F | Wt 209.2 lb

## 2020-06-16 DIAGNOSIS — B029 Zoster without complications: Secondary | ICD-10-CM | POA: Diagnosis not present

## 2020-06-16 DIAGNOSIS — H9202 Otalgia, left ear: Secondary | ICD-10-CM | POA: Diagnosis not present

## 2020-06-16 MED ORDER — PROMETHAZINE HCL 25 MG PO TABS
25.0000 mg | ORAL_TABLET | Freq: Three times a day (TID) | ORAL | 0 refills | Status: DC | PRN
Start: 2020-06-16 — End: 2020-08-28

## 2020-06-16 MED ORDER — MEPERIDINE HCL 50 MG PO TABS: 50.0000 mg | ORAL_TABLET | Freq: Four times a day (QID) | ORAL | 0 refills | Status: DC | PRN

## 2020-06-16 MED ORDER — VALACYCLOVIR HCL 1 G PO TABS
1000.0000 mg | ORAL_TABLET | Freq: Three times a day (TID) | ORAL | 0 refills | Status: DC
Start: 2020-06-16 — End: 2020-08-01

## 2020-06-16 NOTE — Patient Instructions (Signed)
Shingles  Shingles is an infection. It gives you a painful skin rash and blisters that have fluid in them. Shingles is caused by the same germ (virus) that causes chickenpox. Shingles only happens in people who:  Have had chickenpox.  Have been given a shot of medicine (vaccine) to protect against chickenpox. Shingles is rare in this group. The first symptoms of shingles may be itching, tingling, or pain in an area on your skin. A rash will show on your skin a few days or weeks later. The rash is likely to be on one side of your body. The rash usually has a shape like a belt or a band. Over time, the rash turns into fluid-filled blisters. The blisters will break open, change into scabs, and dry up. Medicines may:  Help with pain and itching.  Help you get better sooner.  Help to prevent long-term problems. Follow these instructions at home: Medicines  Take over-the-counter and prescription medicines only as told by your doctor.  Put on an anti-itch cream or numbing cream where you have a rash, blisters, or scabs. Do this as told by your doctor. Helping with itching and discomfort   Put cold, wet cloths (cold compresses) on the area of the rash or blisters as told by your doctor.  Cool baths can help you feel better. Try adding baking soda or dry oatmeal to the water to lessen itching. Do not bathe in hot water. Blister and rash care  Keep your rash covered with a loose bandage (dressing).  Wear loose clothing that does not rub on your rash.  Keep your rash and blisters clean. To do this, wash the area with mild soap and cool water as told by your doctor.  Check your rash every day for signs of infection. Check for: ? More redness, swelling, or pain. ? Fluid or blood. ? Warmth. ? Pus or a bad smell.  Do not scratch your rash. Do not pick at your blisters. To help you to not scratch: ? Keep your fingernails clean and cut short. ? Wear gloves or mittens when you sleep, if  scratching is a problem. General instructions  Rest as told by your doctor.  Keep all follow-up visits as told by your doctor. This is important.  Wash your hands often with soap and water. If soap and water are not available, use hand sanitizer. Doing this lowers your chance of getting a skin infection caused by germs (bacteria).  Your infection can cause chickenpox in people who have never had chickenpox or never got a shot of chickenpox vaccine. If you have blisters that did not change into scabs yet, try not to touch other people or be around other people, especially: ? Babies. ? Pregnant women. ? Children who have areas of red, itchy, or rough skin (eczema). ? Very old people who have transplants. ? People who have a long-term (chronic) sickness, like cancer or AIDS. Contact a doctor if:  Your pain does not get better with medicine.  Your pain does not get better after the rash heals.  You have any signs of infection in the rash area. These signs include: ? More redness, swelling, or pain around the rash. ? Fluid or blood coming from the rash. ? The rash area feeling warm to the touch. ? Pus or a bad smell coming from the rash. Get help right away if:  The rash is on your face or nose.  You have pain in your face or pain by   your eye.  You lose feeling on one side of your face.  You have trouble seeing.  You have ear pain, or you have ringing in your ear.  You have a loss of taste.  Your condition gets worse. Summary  Shingles gives you a painful skin rash and blisters that have fluid in them.  Shingles is an infection. It is caused by the same germ (virus) that causes chickenpox.  Keep your rash covered with a loose bandage (dressing). Wear loose clothing that does not rub on your rash.  If you have blisters that did not change into scabs yet, try not to touch other people or be around people. This information is not intended to replace advice given to you by  your health care provider. Make sure you discuss any questions you have with your health care provider. Document Revised: 03/19/2019 Document Reviewed: 07/30/2017 Elsevier Patient Education  2020 [employer]  

## 2020-06-16 NOTE — Progress Notes (Signed)
   Subjective:    Patient ID: Reina Fuse, female    DOB: Mar 09, 1956, 64 y.o.   MRN: [id number]  HPI Patient comes in today with complaints of severe left ear pain that radiates down her jaw, sinus headache and nausea since Wednesday. Has tried tylenol and otc ear drops with no relief.  Patient relates difficulty with even brushing her hair on the left side because of the sensitivity of the left ear She relates the ear feels like it is very painful she denies popping congestion drainage.  She states when she brushes her hair she cannot get close to the ear because of the pain.  Some radiation into the jaw bone and into the upper forehead Review of Systems     Objective:   Physical Exam  Ear exam normal eardrum normal side of face normal no rash.  Neck normal TMJ normal lungs clear heart regular      Assessment & Plan:  Possible shingles Patient states all pain medicines give her trouble except for Demerol.  She states in the past she is taking this and she usually takes it with Phenergan Small prescription given caution drowsiness no driving with the medicine With the possibility of shingles we will go ahead with Valtrex 3 times daily She is to give [place] an update Monday If ongoing troubles or problems notify us Give [place] update early next week how things are going obviously go to ER if any type of significant problems occur

## 2020-07-20 ENCOUNTER — Ambulatory Visit (HOSPITAL_COMMUNITY): Payer: Self-pay

## 2020-07-20 ENCOUNTER — Ambulatory Visit (INDEPENDENT_AMBULATORY_CARE_PROVIDER_SITE_OTHER): Payer: Medicare Other | Admitting: Family Medicine

## 2020-07-20 ENCOUNTER — Other Ambulatory Visit: Payer: Self-pay

## 2020-07-20 ENCOUNTER — Other Ambulatory Visit: Payer: Self-pay | Admitting: Family Medicine

## 2020-07-20 VITALS — BP 130/82 | Temp 97.2°F | Ht 68.0 in | Wt 204.6 lb

## 2020-07-20 DIAGNOSIS — R1013 Epigastric pain: Secondary | ICD-10-CM

## 2020-07-20 DIAGNOSIS — Z1211 Encounter for screening for malignant neoplasm of colon: Secondary | ICD-10-CM | POA: Diagnosis not present

## 2020-07-20 DIAGNOSIS — Z01419 Encounter for gynecological examination (general) (routine) without abnormal findings: Secondary | ICD-10-CM | POA: Diagnosis not present

## 2020-07-20 DIAGNOSIS — K591 Functional diarrhea: Secondary | ICD-10-CM

## 2020-07-20 DIAGNOSIS — Z13820 Encounter for screening for osteoporosis: Secondary | ICD-10-CM | POA: Diagnosis not present

## 2020-07-20 DIAGNOSIS — Z0000 Encounter for general adult medical examination without abnormal findings: Secondary | ICD-10-CM

## 2020-07-20 DIAGNOSIS — Z13220 Encounter for screening for lipoid disorders: Secondary | ICD-10-CM

## 2020-07-20 DIAGNOSIS — Z780 Asymptomatic menopausal state: Secondary | ICD-10-CM | POA: Diagnosis not present

## 2020-07-20 DIAGNOSIS — R1011 Right upper quadrant pain: Secondary | ICD-10-CM

## 2020-07-20 DIAGNOSIS — R1010 Upper abdominal pain, unspecified: Secondary | ICD-10-CM

## 2020-07-20 DIAGNOSIS — Z1231 Encounter for screening mammogram for malignant neoplasm of breast: Secondary | ICD-10-CM | POA: Diagnosis not present

## 2020-07-20 DIAGNOSIS — Z23 Encounter for immunization: Secondary | ICD-10-CM | POA: Diagnosis not present

## 2020-07-20 MED ORDER — HYDROXYZINE HCL 50 MG PO TABS: 50.0000 mg | ORAL_TABLET | Freq: Three times a day (TID) | ORAL | 3 refills | Status: DC | PRN

## 2020-07-20 MED ORDER — DIAZEPAM 10 MG PO TABS: 10.0000 mg | ORAL_TABLET | Freq: Every evening | ORAL | 5 refills | Status: DC | PRN

## 2020-07-20 MED ORDER — SHINGRIX 50 MCG/0.5ML IM SUSR: 0.5000 mL | Freq: Once | INTRAMUSCULAR | 1 refills | Status: AC

## 2020-07-20 NOTE — Progress Notes (Unsigned)
AWV- Annual Wellness Visit  The patient was seen for their annual wellness visit. The patient's past medical history, surgical history, and family history were reviewed. Pertinent vaccines were reviewed ( tetanus, pneumonia, shingles, flu) The patient's medication list was reviewed and updated.  The height and weight were entered.  BMI recorded in electronic record elsewhere  Cognitive screening was completed. Outcome of Mini - Cog: ***   Falls /depression screening electronically recorded within record elsewhere  Current tobacco usage:*** (All patients who use tobacco were given written and verbal information on quitting)  Recent listing of emergency department/hospitalizations over the past year were reviewed.  current specialist the patient sees on a regular basis: ***   Medicare annual wellness visit patient questionnaire was reviewed.  A written screening schedule for the patient for the next 5-10 years was given. Appropriate discussion of followup regarding next visit was discussed.

## 2020-07-20 NOTE — Progress Notes (Signed)
Subjective:    Patient ID: Andrea Miles, female    DOB: July 31, 1956, 64 y.o.   MRN: [id number]  HPI  The patient comes in today for a wellness visit. Well woman exam - Plan: PR ELECTROCARDIOGRAM, COMPLETE  Epigastric pain - Plan: US Abdomen Complete, Lipase, Hepatic function panel, CBC with Differential/Platelet, Basic metabolic panel  Post-menopausal - Plan: DG Bone Density  Screening for osteoporosis - Plan: DG Bone Density  Encounter for screening colonoscopy - Plan: Ambulatory referral to Gastroenterology  RUQ pain - Plan: US Abdomen Complete, Hepatic function panel, CBC with Differential/Platelet, Basic metabolic panel  Visit for screening mammogram - Plan: MM DIGITAL SCREENING BILATERAL  Screening, lipid - Plan: Lipid panel  Need for vaccination - Plan: Pneumococcal polysaccharide vaccine 23-valent greater than or equal to 2yo subcutaneous/IM     A review of their health history was completed.  A review of medications was also completed.  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   Additional concerns: patient would like sone skin moles looked at Patient would like referral to surgeon for gall bladder removal- patient with gallstones and bad gallbladder but had to wait for insurance to go see surgeon for removal.   Review of Systems  Constitutional: Negative for activity change, appetite change and fatigue.  HENT: Negative for congestion and rhinorrhea.   Respiratory: Negative for cough and shortness of breath.   Cardiovascular: Negative for chest pain and leg swelling.  Gastrointestinal: Negative for abdominal pain and diarrhea.  Endocrine: Negative for polydipsia and polyphagia.  Skin: Negative for color change.  Neurological: Negative for dizziness and weakness.   Psychiatric/Behavioral: Negative for behavioral problems and confusion.       Objective:   Physical Exam Vitals reviewed.  Constitutional:      General: She is not in acute distress. HENT:     Head: Normocephalic and atraumatic.  Eyes:     General:        Right eye: No discharge.        Left eye: No discharge.  Neck:     Trachea: No tracheal deviation.  Cardiovascular:     Rate and Rhythm: Normal rate and regular rhythm.     Heart sounds: Normal heart sounds. No murmur heard.   Pulmonary:     Effort: Pulmonary effort is normal. No respiratory distress.     Breath sounds: Normal breath sounds.  Lymphadenopathy:     Cervical: No cervical adenopathy.  Skin:    General: Skin is warm and dry.  Neurological:     Mental Status: She is alert.     Coordination: Coordination normal.  Psychiatric:        Behavior: Behavior normal.   Breast exam normal bilateral abdomen soft no guarding rebound or tenderness GU hysterectomy pelvic exam normal no adnexal masses Pap smear not indicated        Assessment & Plan:  1. Well woman exam Adult wellness-complete.wellness physical was conducted today. Importance of diet and exercise were discussed in detail.  In addition to this a discussion regarding safety was also covered. We also reviewed over immunizations and gave recommendations regarding current immunization needed for age.  In addition to this additional areas were also touched on including: Preventative health exams needed:  Colonoscopy referral for colonoscopy Mammogram recommended  and ordered Pelvic exam completed breast exam normal no Pap indicated  Patient was advised yearly wellness exam  - PR ELECTROCARDIOGRAM, COMPLETE  2. Epigastric pain Intermittent upper abdominal pains with diarrhea patient is concerned that this is her gallbladder but it could also be pancreatic labs ordered patient has history of previous gallstones has not had an ultrasound in greater than 7  years - US Abdomen Complete - Lipase - Hepatic function panel - CBC with Differential/Platelet - Basic metabolic panel  3. Post-menopausal Bone density indicated - DG Bone Density  4. Screening for osteoporosis See above - DG Bone Density  5. Encounter for screening colonoscopy See above - Ambulatory referral to Gastroenterology  6. RUQ pain Recommend ultrasound lab work further evaluation of the right upper quadrant and epigastric pain may need GI consultation before seeing surgeon - US Abdomen Complete - Hepatic function panel - CBC with Differential/Platelet - Basic metabolic panel  7. Visit for screening mammogram Ordered - MM DIGITAL SCREENING BILATERAL  8. Screening, lipid Ordered - Lipid panel  9. Need for vaccination Today Also prescription for Shingrix given - Pneumococcal polysaccharide vaccine 23-valent greater than or equal to 2yo subcutaneous/IM  Baseline EKG for Medicare purposes normal

## 2020-07-21 ENCOUNTER — Encounter: Payer: Self-pay | Admitting: Family Medicine

## 2020-07-21 LAB — LIPID PANEL
Chol/HDL Ratio: 4.5 ratio — ABNORMAL HIGH (ref 0.0–4.4)
Cholesterol, Total: 192 mg/dL (ref 100–199)
HDL: 43 mg/dL (ref >39)
LDL Chol Calc (NIH): 129 mg/dL — ABNORMAL HIGH (ref 0–99)
Triglycerides: 108 mg/dL (ref 0–149)
VLDL Cholesterol Cal: 20 mg/dL (ref 5–40)

## 2020-07-21 LAB — CBC WITH DIFFERENTIAL/PLATELET
Basophils Absolute: 0.1 10*3/uL (ref 0.0–0.2)
Basos: 1 %
EOS (ABSOLUTE): 0.1 10*3/uL (ref 0.0–0.4)
Eos: 1 %
Hematocrit: 47.1 % — ABNORMAL HIGH (ref 34.0–46.6)
Hemoglobin: 16.5 g/dL — ABNORMAL HIGH (ref 11.1–15.9)
Immature Grans (Abs): 0 10*3/uL (ref 0.0–0.1)
Immature Granulocytes: 0 %
Lymphocytes Absolute: 3.1 10*3/uL (ref 0.7–3.1)
Lymphs: 30 %
MCH: 31 pg (ref 26.6–33.0)
MCHC: 35 g/dL (ref 31.5–35.7)
MCV: 89 fL (ref 79–97)
Monocytes Absolute: 0.6 10*3/uL (ref 0.1–0.9)
Monocytes: 6 %
Neutrophils Absolute: 6.3 10*3/uL (ref 1.4–7.0)
Neutrophils: 62 %
Platelets: 408 10*3/uL (ref 150–450)
RBC: 5.32 x10E6/uL — ABNORMAL HIGH (ref 3.77–5.28)
RDW: 13.1 % (ref 11.7–15.4)
WBC: 10.3 10*3/uL (ref 3.4–10.8)

## 2020-07-21 LAB — HEPATIC FUNCTION PANEL
ALT: 22 IU/L (ref 0–32)
AST: 34 IU/L (ref 0–40)
Albumin: 4.9 g/dL — ABNORMAL HIGH (ref 3.8–4.8)
Alkaline Phosphatase: 101 IU/L (ref 48–121)
Bilirubin Total: 0.8 mg/dL (ref 0.0–1.2)
Bilirubin, Direct: 0.21 mg/dL (ref 0.00–0.40)
Total Protein: 8.3 g/dL (ref 6.0–8.5)

## 2020-07-21 LAB — BASIC METABOLIC PANEL
BUN/Creatinine Ratio: 15 (ref 12–28)
BUN: 16 mg/dL (ref 8–27)
CO2: 27 mmol/L (ref 20–29)
Calcium: 10.3 mg/dL (ref 8.7–10.3)
Chloride: 98 mmol/L (ref 96–106)
Creatinine, Ser: 1.05 mg/dL — ABNORMAL HIGH (ref 0.57–1.00)
GFR calc Af Amer: 64 mL/min/{1.73_m2} (ref >59)
GFR calc non Af Amer: 56 mL/min/{1.73_m2} — ABNORMAL LOW (ref >59)
Glucose: 101 mg/dL — ABNORMAL HIGH (ref 65–99)
Potassium: 3.8 mmol/L (ref 3.5–5.2)
Sodium: 141 mmol/L (ref 134–144)

## 2020-07-21 LAB — LIPASE: Lipase: 28 U/L (ref 14–72)

## 2020-07-24 ENCOUNTER — Encounter: Payer: Self-pay | Admitting: Family Medicine

## 2020-07-26 ENCOUNTER — Other Ambulatory Visit: Payer: Self-pay | Admitting: *Deleted

## 2020-07-26 DIAGNOSIS — D582 Other hemoglobinopathies: Secondary | ICD-10-CM

## 2020-07-26 DIAGNOSIS — E785 Hyperlipidemia, unspecified: Secondary | ICD-10-CM

## 2020-07-26 DIAGNOSIS — Z79899 Other long term (current) drug therapy: Secondary | ICD-10-CM

## 2020-07-26 DIAGNOSIS — R7989 Other specified abnormal findings of blood chemistry: Secondary | ICD-10-CM

## 2020-07-27 ENCOUNTER — Encounter: Payer: Self-pay | Admitting: Family Medicine

## 2020-07-28 ENCOUNTER — Other Ambulatory Visit: Payer: Self-pay

## 2020-07-28 ENCOUNTER — Ambulatory Visit (HOSPITAL_COMMUNITY)
Admission: RE | Admit: 2020-07-28 | Discharge: 2020-07-28 | Disposition: A | Discharge status: Home / Self Care | Payer: Medicare Other | Source: Ambulatory Visit | Attending: Family Medicine | Admitting: Family Medicine

## 2020-07-28 DIAGNOSIS — R1013 Epigastric pain: Secondary | ICD-10-CM | POA: Diagnosis present

## 2020-07-28 DIAGNOSIS — R1011 Right upper quadrant pain: Secondary | ICD-10-CM | POA: Diagnosis present

## 2020-07-31 ENCOUNTER — Encounter: Payer: Self-pay | Admitting: Internal Medicine

## 2020-07-31 NOTE — Progress Notes (Signed)
Referring Provider: Kathyrn Drown, MD Primary Care Physician:  Kathyrn Drown, MD Primary GI: Dr. Gala Romney   Chief Complaint  Patient presents with  . Abdominal Pain    ruq, abnormal US gallbladder  . Colonoscopy    last tcs age 64  . Diarrhea    sometimes after eating    HPI:   Andrea Miles is a 64 y.o. female presenting today to arrange routine screening colonoscopy. Last in 2008 normal.   US abdomen complete Aug 2021: contracted gallbladder containing 13 mm diameter calculus, gallbladder wall borderline prominent though could be related to underdistention, no evidence of acute cholecystitis, probable fatty liver, borderline prominent CBD 7 mm diameter. HFP normal  Long-standing history of RUQ pain, dating back to at least 2008. Didn't have insurance at the time to undergo cholecystectomy. Underlying pain constantly, waxing and waning. Hurts more if doesn't eat. Sometimes feels better after eating. No typical GERD symptoms. No dysphagia. No N/V. No constipation. Occasional loose stool if eating spicy or greasy foods. Avoids fried foods. No overt GI bleeding. No weight loss or lack of appetite. 81 mg aspirin daily. No PPI. No prior EGD.    Past Medical History:  Diagnosis Date  . Gallstones   . Kidney stones   . Migraine    with aura, last in her 25s    Past Surgical History:  Procedure Laterality Date  . ABDOMINAL HYSTERECTOMY     no cancer complete hysterectomy 2010  . APPENDECTOMY    . COLONOSCOPY  2008   Dr. Gala Romney: normal    Current Outpatient Medications  Medication Sig Dispense Refill  . acetaminophen (TYLENOL) 500 MG tablet Take 1,000 mg by mouth every 6 (six) hours as needed for pain.     . diazepam (VALIUM) 10 MG tablet Take 1 tablet (10 mg total) by mouth at bedtime as needed for sleep. 30 tablet 5  . hydrOXYzine (ATARAX/VISTARIL) 50 MG tablet Take 1 tablet (50 mg total) by mouth 3 (three) times daily as needed for itching. 90 tablet 3  . OVER THE  COUNTER MEDICATION Biotin Apple Cider Vinegar 2500 per serving woman's one a day Luten for eyes ASA 81 mg Cranberry 500 mg b12 1061mcg Move free Glucosamine and chondrition Calcium - 1200 mg    . promethazine (PHENERGAN) 25 MG tablet Take 1 tablet (25 mg total) by mouth every 8 (eight) hours as needed for nausea or vomiting. 20 tablet 0   No current facility-administered medications for this visit.    Allergies as of 08/01/2020 - Review Complete 08/01/2020  Allergen Reaction Noted  . Amitriptyline  08/08/2013  . Codeine  08/08/2013  . Darvocet [propoxyphene n-acetaminophen]  08/08/2013  . Dolamide [chlorpropamide]  07/27/2014  . Oxycodone  08/08/2013  . Percocet [oxycodone-acetaminophen]  07/27/2014  . Sulfa antibiotics  08/08/2013  . Toradol [ketorolac tromethamine]  08/08/2013    Family History  Problem Relation Age of Onset  . Cancer Other        breast  . Lung cancer Father   . Colon cancer Neg Hx   . Colon polyps Neg Hx     Social History   Socioeconomic History  . Marital status: Widowed    Spouse name: Not on file  . Number of children: Not on file  . Years of education: Not on file  . Highest education level: Not on file  Occupational History  . Not on file  Tobacco Use  . Smoking status:  Never Smoker  . Smokeless tobacco: Never Used  Substance and Sexual Activity  . Alcohol use: No  . Drug use: No  . Sexual activity: Yes    Birth control/protection: Surgical  Other Topics Concern  . Not on file  Social History Narrative  . Not on file   Social Determinants of Health   Financial Resource Strain:   . Difficulty of Paying Living Expenses: Not on file  Food Insecurity:   . Worried About Charity fundraiser in the Last Year: Not on file  . Ran Out of Food in the Last Year: Not on file  Transportation Needs:   . Lack of Transportation (Medical): Not on file  . Lack of Transportation (Non-Medical): Not on file  Physical Activity:   . Days of  Exercise per Week: Not on file  . Minutes of Exercise per Session: Not on file  Stress:   . Feeling of Stress : Not on file  Social Connections:   . Frequency of Communication with Friends and Family: Not on file  . Frequency of Social Gatherings with Friends and Family: Not on file  . Attends Religious Services: Not on file  . Active Member of Clubs or Organizations: Not on file  . Attends Archivist Meetings: Not on file  . Marital Status: Not on file    Review of Systems: Gen: Denies fever, chills, anorexia. Denies fatigue, weakness, weight loss.  CV: Denies chest pain, palpitations, syncope, peripheral edema, and claudication. Resp: Denies dyspnea at rest, cough, wheezing, coughing up blood, and pleurisy. GI: see HPI Derm: Denies rash, itching, dry skin Psych: Denies depression, anxiety, memory loss, confusion. No homicidal or suicidal ideation.  Heme: Denies bruising, bleeding, and enlarged lymph nodes.  Physical Exam: BP (!) 142/86   Pulse 97   Temp (!) 96.6 F (35.9 C) (Temporal)   Ht 5\' 8"  (1.727 m)   Wt 208 lb (94.3 kg)   BMI 31.63 kg/m  General:   Alert and oriented. No distress noted. Pleasant and cooperative.  Head:  Normocephalic and atraumatic. Eyes:  Conjuctiva clear without scleral icterus. Mouth: mask in place Cardiac: S1 S2 present, no murmurs Lungs: clear bilaterally Abdomen:  +BS, soft, mild TTP RUQ and non-distended. No rebound or guarding. No HSM or masses noted. Msk:  Symmetrical without gross deformities. Normal posture. Extremities:  Without edema. Neurologic:  Alert and  oriented x4 Psych:  Alert and cooperative. Normal mood and affect.  ASSESSMENT: Andrea Miles is a 64 y.o. female presenting today with need for routine screening colonoscopy, with last in 2008 normal. No concerning lower GI signs/symptoms.  Interestingly, she reports long-standing history of RUQ pain dating back to at least 2008; however, symptoms are not typical  biliary as pain is not necessarily postprandial and at times she feels BETTER after eating. 81 mg aspirin daily, no PPI. No dysphagia or typical GERD symptoms. No prior EGD. US abdomen with contracted gallbladder with 13 mm diameter calculus, fatty liver, borderline prominent CBD at 7 mm. HFP completely normal. We discussed EGD to rule out any GI component. At her request, also referring to surgery to discuss elective cholecystectomy once GI work-up complete. Although known gallstone(s), does not appear to be typical biliary presentation. If any bump in LFTs, pursue MRI/MRCP.     PLAN:   Proceed with TCS/EGD with Dr. Gala Romney in near future using PROPOFOL: the risks, benefits, and alternatives have been discussed with the patient in detail. The patient states understanding and  desires to proceed. ASA II  Update LFTs if worsening pain: if bump in LFTs then pursue MRI/MRCP  Referral to Gen Surgery as well  4-6 month follow-up  Annitta Needs, PhD, ANP-BC [facility] Gastroenter[facility]

## 2020-08-01 ENCOUNTER — Other Ambulatory Visit: Payer: Self-pay

## 2020-08-01 ENCOUNTER — Ambulatory Visit (INDEPENDENT_AMBULATORY_CARE_PROVIDER_SITE_OTHER): Payer: Medicare Other | Admitting: Gastroenterology

## 2020-08-01 ENCOUNTER — Other Ambulatory Visit: Payer: Self-pay | Admitting: *Deleted

## 2020-08-01 ENCOUNTER — Encounter: Payer: Self-pay | Admitting: *Deleted

## 2020-08-01 ENCOUNTER — Encounter: Payer: Self-pay | Admitting: Gastroenterology

## 2020-08-01 DIAGNOSIS — R1011 Right upper quadrant pain: Secondary | ICD-10-CM | POA: Insufficient documentation

## 2020-08-01 DIAGNOSIS — Z1211 Encounter for screening for malignant neoplasm of colon: Secondary | ICD-10-CM | POA: Diagnosis not present

## 2020-08-01 NOTE — Patient Instructions (Signed)
We are arranging a colonoscopy and upper endoscopy in near future with Dr. Gala Romney.  I am referring you to General Surgery, Dr. Constance Haw, just to talk about gallbladder removal. Your symptoms are not typical for this, so we will see if there is anything GI-related going on.   We will see you back in follow-up thereafter!  It was a pleasure to see you today. I want to create trusting relationships with patients to provide genuine, compassionate, and quality care. I value your feedback. If you receive a survey regarding your visit,  I greatly appreciate you taking time to fill this out.   Annitta Needs, PhD, ANP-BC [facility]astroenter[facility]

## 2020-08-01 NOTE — Progress Notes (Signed)
Cc'ed to pcp °

## 2020-08-10 ENCOUNTER — Other Ambulatory Visit: Payer: Self-pay

## 2020-08-10 ENCOUNTER — Encounter: Payer: Self-pay | Admitting: General Surgery

## 2020-08-10 ENCOUNTER — Ambulatory Visit (INDEPENDENT_AMBULATORY_CARE_PROVIDER_SITE_OTHER): Payer: Medicare Other | Admitting: General Surgery

## 2020-08-10 VITALS — BP 140/89 | HR 84 | Temp 98.3°F | Resp 16 | Ht 68.0 in | Wt 208.0 lb

## 2020-08-10 DIAGNOSIS — K8020 Calculus of gallbladder without cholecystitis without obstruction: Secondary | ICD-10-CM

## 2020-08-10 NOTE — Progress Notes (Signed)
[facility] History and Physical  Reason for Referral: Gallstones  Referring Physician: Dr. Gala Miles   Chief Complaint    New Patient (Initial Visit)      Andrea Miles is a 64 y.o. female.  HPI: Andrea Miles is a 64 yo who has a history of RUQ pain for years at least to 2008 and who describes the pain as waxing and waning and not really associated with food. She says it hurts more when she does not eat and then at times feels better with food. She has some loose stools with greasy food but nothing else specific. She avoid fried foods.  She has been seen by GI for screening colonoscopy and EGD. She was referred for gallstones given her complaints.   Past Medical History:  Diagnosis Date  . Gallstones   . Kidney stones   . Migraine    with aura, last in her 27s    Past Surgical History:  Procedure Laterality Date  . ABDOMINAL HYSTERECTOMY     no cancer complete hysterectomy 2010  . APPENDECTOMY    . COLONOSCOPY  2008   Dr. Gala Miles: normal    Family History  Problem Relation Age of Onset  . Cancer Other        breast  . Lung cancer Father   . Colon cancer Neg Hx   . Colon polyps Neg Hx     Social History   Tobacco Use  . Smoking status: Never Smoker  . Smokeless tobacco: Never Used  Substance Use Topics  . Alcohol use: No  . Drug use: No    Medications: I have reviewed the patient's current medications. Allergies as of 08/10/2020      Reactions   Amitriptyline    seizures   Codeine    Put pt in coma   Darvocet [propoxyphene N-acetaminophen]    n/v   Dolamide [chlorpropamide]    Increases Blood Pressure   Oxycodone    n/v   Percocet [oxycodone-acetaminophen]    Increases Blood Pressure   Sulfa Antibiotics    Swelling   Toradol [ketorolac Tromethamine]    Swelling      Medication List       Accurate as of August 10, 2020  2:09 PM. If you have any questions, ask your nurse or doctor.        acetaminophen 500 MG tablet Commonly  known as: TYLENOL Take 1,000 mg by mouth every 6 (six) hours as needed for pain.   diazepam 10 MG tablet Commonly known as: VALIUM Take 1 tablet (10 mg total) by mouth at bedtime as needed for sleep.   hydrOXYzine 50 MG tablet Commonly known as: ATARAX/VISTARIL Take 1 tablet (50 mg total) by mouth 3 (three) times daily as needed for itching.   OVER THE COUNTER MEDICATION Biotin Apple Cider Vinegar 2500 per serving woman's one a day Luten for eyes ASA 81 mg Cranberry 500 mg b12 109mcg Move free Glucosamine and chondrition Calcium - 1200 mg   promethazine 25 MG tablet Commonly known as: PHENERGAN Take 1 tablet (25 mg total) by mouth every 8 (eight) hours as needed for nausea or vomiting.        ROS:  A comprehensive review of systems was negative except for: Gastrointestinal: positive for abdominal pain and nausea Musculoskeletal: positive for back pain, neck pain and joint pain  Blood pressure 140/89, pulse 84, temperature 98.3 F (36.8 C), temperature source Oral, resp. rate 16, height 5\' 8"  (1.727 m), weight  208 lb (94.3 kg), SpO2 95 %. Physical Exam Vitals reviewed.  Constitutional:      Appearance: Normal appearance.  HENT:     Head: Normocephalic.     Nose: Nose normal.     Mouth/Throat:     Mouth: Mucous membranes are moist.  Eyes:     Extraocular Movements: Extraocular movements intact.  Cardiovascular:     Rate and Rhythm: Normal rate and regular rhythm.  Pulmonary:     Effort: Pulmonary effort is normal.     Breath sounds: Normal breath sounds.  Abdominal:     General: There is no distension.     Palpations: Abdomen is soft.     Tenderness: There is abdominal tenderness in the right upper quadrant.  Musculoskeletal:        General: Normal range of motion.     Cervical back: No rigidity.  Skin:    General: Skin is warm and dry.  Neurological:     General: No focal deficit present.     Mental Status: She is alert and oriented to person, place,  and time.  Psychiatric:        Mood and Affect: Mood normal.        Behavior: Behavior normal.        Thought Content: Thought content normal.        Judgment: Judgment normal.     Results: Narrative & Impression  CLINICAL DATA:  RIGHT upper quadrant epigastric pain intermittently  EXAM: ABDOMEN ULTRASOUND COMPLETE  COMPARISON:  08/09/2013  FINDINGS: Gallbladder: Shadowing calculus in gallbladder 13 mm thick. Gallbladder poorly distended, cannot exclude additional calculi at lower gallbladder segment due to shadowing from observed stone. No pericholecystic fluid or sonographic Murphy sign.  Common bile duct: Diameter: 7 mm, borderline prominent  Liver: Echogenic parenchyma, likely fatty infiltration though this can be seen with cirrhosis and certain infiltrative disorders. No focal hepatic mass or nodularity visualized. Portal vein is patent on color Doppler imaging with normal direction of blood flow towards the liver.  IVC: Normal appearance  Pancreas: Normal appearance  Spleen: Normal appearance, 10.0 cm length  Right Kidney: Length: 10.2 cm. Normal morphology without mass or hydronephrosis.  Left Kidney: Length: 10.9 cm. Normal morphology without mass or hydronephrosis.  Abdominal aorta: Normal caliber  Other findings: No free fluid  IMPRESSION: Contracted gallbladder containing a 13 mm diameter calculus.  Gallbladder wall borderline prominent though this could be related to underdistention.  No definite scintigraphic evidence of acute cholecystitis.  Probable fatty infiltration of liver as above.  Borderline prominent CBD 7 mm diameter, correlate with LFTs.   Electronically Signed   By: Andrea Dana M.D.   On: 07/28/2020 11:18   Results for Andrea Miles, Andrea Miles (MRN [id number]) as of 08/10/2020 14:09  Ref. Range 07/20/2020 12:08  Sodium Latest Ref Range: 134 - 144 mmol/L 141  Potassium Latest Ref Range: 3.5 - 5.2 mmol/L 3.8  Chloride  Latest Ref Range: 96 - 106 mmol/L 98  CO2 Latest Ref Range: 20 - 29 mmol/L 27  Glucose Latest Ref Range: 65 - 99 mg/dL 101 (H)  BUN Latest Ref Range: 8 - 27 mg/dL 16  Creatinine Latest Ref Range: 0.57 - 1.00 mg/dL 1.05 (H)  Calcium Latest Ref Range: 8.7 - 10.3 mg/dL 10.3  BUN/Creatinine Ratio Latest Ref Range: 12 - 28  15  Alkaline Phosphatase Latest Ref Range: 48 - 121 IU/L 101  Albumin Latest Ref Range: 3.8 - 4.8 g/dL 4.9 (H)  Lipase Latest Ref Range:  14 - 72 U/L 28  AST Latest Ref Range: 0 - 40 IU/L 34  ALT Latest Ref Range: 0 - 32 IU/L 22  Total Protein Latest Ref Range: 6.0 - 8.5 g/dL 8.3  Total Bilirubin Latest Ref Range: 0.0 - 1.2 mg/dL 0.8  GFR, Est Non African American Latest Ref Range: >59 mL/min/1.73 56 (L)  GFR, Est African American Latest Ref Range: >59 mL/min/1.73 64  BILIRUBIN, DIRECT Latest Ref Range: 0.00 - 0.40 mg/dL 0.21    Assessment & Plan:  Andrea Miles is a 64 y.o. female with possible symptomatic cholelithiasis with long standing RUQ pain and possible some degree of chronic cholecystitis now given the contraction of the gallbladder. She wants to proceed with removal. We discussed that since her symptoms are not classic that this may not resolve her pain. We discussed that she is getting an EGD with GI also which is good. She would like surgery the week of 9/20 but I am on vacation. She is in agreement with proceeding with Dr. Arnoldo Morale during her surgery.   PLAN: I counseled the patient about the indication, risks and benefits of laparoscopic cholecystectomy.  She understands there is a very small chance for bleeding, infection, injury to normal structures (including common bile duct), conversion to open surgery, persistent symptoms, evolution of postcholecystectomy diarrhea, need for secondary interventions, anesthesia reaction, cardiopulmonary issues and other risks not specifically detailed here. I described the expected recovery, the plan for follow-up and the  restrictions during the recovery phase.  All questions were answered.  COVID preop test discussed   All questions were answered to the satisfaction of the patient.   Andrea Miles 08/10/2020, 2:09 PM

## 2020-08-10 NOTE — Patient Instructions (Signed)
Cholelithiasis  Cholelithiasis is also called "gallstones." It is a kind of gallbladder disease. The gallbladder is an organ that stores a liquid (bile) that helps you digest fat. Gallstones may not cause symptoms (may be silent gallstones) until they cause a blockage, and then they can cause pain (gallbladder attack). Follow these instructions at home:  Take over-the-counter and prescription medicines only as told by your doctor.  Stay at a healthy weight.  Eat healthy foods. This includes: ? Eating fewer fatty foods, like fried foods. ? Eating fewer refined carbs (refined carbohydrates). Refined carbs are breads and grains that are highly processed, like white bread and white rice. Instead, choose whole grains like whole-wheat bread and brown rice. ? Eating more fiber. Almonds, fresh fruit, and beans are healthy sources of fiber.  Keep all follow-up visits as told by your doctor. This is important. Contact a doctor if:  You have sudden pain in the upper right side of your belly (abdomen). Pain might spread to your right shoulder or your chest. This may be a sign of a gallbladder attack.  You feel sick to your stomach (are nauseous).  You throw up (vomit).  You have been diagnosed with gallstones that have no symptoms and you get: ? Belly pain. ? Discomfort, burning, or fullness in the upper part of your belly (indigestion). Get help right away if:  You have sudden pain in the upper right side of your belly, and it lasts for more than 2 hours.  You have belly pain that lasts for more than 5 hours.  You have a fever or chills.  You keep feeling sick to your stomach or you keep throwing up.  Your skin or the whites of your eyes turn yellow (jaundice).  You have dark-colored pee (urine).  You have light-colored poop (stool). Summary  Cholelithiasis is also called "gallstones."  The gallbladder is an organ that stores a liquid (bile) that helps you digest fat.  Silent  gallstones are gallstones that do not cause symptoms.  A gallbladder attack may cause sudden pain in the upper right side of your belly. Pain might spread to your right shoulder or your chest. If this happens, contact your doctor.  If you have sudden pain in the upper right side of your belly that lasts for more than 2 hours, get help right away. This information is not intended to replace advice given to you by your health care provider. Make sure you discuss any questions you have with your health care provider. Document Revised: 11/07/2017 Document Reviewed: 08/11/2016 Elsevier Patient Education  2020 Else[employer] Inc[employer]    Laparoscopic Cholecystectomy Laparoscopic cholecystectomy is surgery to remove the gallbladder. The gallbladder is a pear-shaped organ that lies beneath the liver on the right side of the body. The gallbladder stores bile, which is a fluid that helps the body to digest fats. Cholecystectomy is often done for inflammation of the gallbladder (cholecystitis). This condition is usually caused by a buildup of gallstones (cholelithiasis) in the gallbladder. Gallstones can block the flow of bile, which can result in inflammation and pain. In severe cases, emergency surgery may be required. This procedure is done though small incisions in your abdomen (laparoscopic surgery). A thin scope with a camera (laparoscope) is inserted through one incision. Thin surgical instruments are inserted through the other incisions. In some cases, a laparoscopic procedure may be turned into a type of surgery that is done through a larger incision (open surgery). Tell a health care provider about:  Any   allergies you have.  All medicines you are taking, including vitamins, herbs, eye drops, creams, and over-the-counter medicines.  Any problems you or family members have had with anesthetic medicines.  Any blood disorders you have.  Any surgeries you have had.  Any medical conditions you  have.  Whether you are pregnant or may be pregnant. What are the risks? Generally, this is a safe procedure. However, problems may occur, including:  Infection.  Bleeding.  Allergic reactions to medicines.  Damage to other structures or organs.  A stone remaining in the common bile duct. The common bile duct carries bile from the gallbladder into the small intestine.  A bile leak from the cyst duct that is clipped when your gallbladder is removed. Medicines  Ask your health care provider about: ? Changing or stopping your regular medicines. This is especially important if you are taking diabetes medicines or blood thinners. ? Taking medicines such as aspirin and ibuprofen. These medicines can thin your blood. Do not take these medicines before your procedure if your health care provider instructs you not to.  You may be given antibiotic medicine to help prevent infection. General instructions  Let your health care provider know if you develop a cold or an infection before surgery.  Plan to have someone take you home from the hospital or clinic.  Ask your health care provider how your surgical site will be marked or identified. What happens during the procedure?   To reduce your risk of infection: ? Your health care team will wash or sanitize their hands. ? Your skin will be washed with soap. ? Hair may be removed from the surgical area.  An IV tube may be inserted into one of your veins.  You will be given one or more of the following: ? A medicine to help you relax (sedative). ? A medicine to make you fall asleep (general anesthetic).  A breathing tube will be placed in your mouth.  Your surgeon will make several small cuts (incisions) in your abdomen.  The laparoscope will be inserted through one of the small incisions. The camera on the laparoscope will send images to a TV screen (monitor) in the operating room. This lets your surgeon see inside your  abdomen.  Air-like gas will be pumped into your abdomen. This will expand your abdomen to give the surgeon more room to perform the surgery.  Other tools that are needed for the procedure will be inserted through the other incisions. The gallbladder will be removed through one of the incisions.  Your common bile duct may be examined. If stones are found in the common bile duct, they may be removed.  After your gallbladder has been removed, the incisions will be closed with stitches (sutures), staples, or skin glue.  Your incisions may be covered with a bandage (dressing). The procedure may vary among health care providers and hospitals. What happens after the procedure?  Your blood pressure, heart rate, breathing rate, and blood oxygen level will be monitored until the medicines you were given have worn off.  You will be given medicines as needed to control your pain.  Do not drive for 24 hours if you were given a sedative. This information is not intended to replace advice given to you by your health care provider. Make sure you discuss any questions you have with your health care provider. Document Revised: 11/07/2017 Document Reviewed: 05/13/2016 Elsevier Patient Education  2020 Elsevier Inc.  

## 2020-08-11 ENCOUNTER — Other Ambulatory Visit (HOSPITAL_COMMUNITY): Payer: Self-pay

## 2020-08-11 ENCOUNTER — Ambulatory Visit (HOSPITAL_COMMUNITY): Payer: Self-pay

## 2020-08-14 DIAGNOSIS — K8020 Calculus of gallbladder without cholecystitis without obstruction: Secondary | ICD-10-CM

## 2020-08-14 NOTE — H&P (Signed)
[facility] History and Physical  Reason for Referral: Gallstones  Referring Physician: Dr. Gala Romney   Chief Complaint    New Patient (Initial Visit)      Andrea Miles is a 64 y.o. female.  HPI: Andrea Miles is a 64 yo who has a history of RUQ pain for years at least to 2008 and who describes the pain as waxing and waning and not really associated with food. She says it hurts more when she does not eat and then at times feels better with food. She has some loose stools with greasy food but nothing else specific. She avoid fried foods.  She has been seen by GI for screening colonoscopy and EGD. She was referred for gallstones given her complaints.   Past Medical History:  Diagnosis Date  . Gallstones   . Kidney stones   . Migraine    with aura, last in her 56s    Past Surgical History:  Procedure Laterality Date  . ABDOMINAL HYSTERECTOMY     no cancer complete hysterectomy 2010  . APPENDECTOMY    . COLONOSCOPY  2008   Dr. Gala Romney: normal    Family History  Problem Relation Age of Onset  . Cancer Other        breast  . Lung cancer Father   . Colon cancer Neg Hx   . Colon polyps Neg Hx     Social History   Tobacco Use  . Smoking status: Never Smoker  . Smokeless tobacco: Never Used  Substance Use Topics  . Alcohol use: No  . Drug use: No    Medications: I have reviewed the patient's current medications. Allergies as of 08/10/2020      Reactions   Amitriptyline    seizures   Codeine    Put pt in coma   Darvocet [propoxyphene N-acetaminophen]    n/v   Dolamide [chlorpropamide]    Increases Blood Pressure   Oxycodone    n/v   Percocet [oxycodone-acetaminophen]    Increases Blood Pressure   Sulfa Antibiotics    Swelling   Toradol [ketorolac Tromethamine]    Swelling      Medication List       Accurate as of August 10, 2020  2:09 PM. If you have any questions, ask your nurse or doctor.        acetaminophen 500 MG tablet Commonly  known as: TYLENOL Take 1,000 mg by mouth every 6 (six) hours as needed for pain.   diazepam 10 MG tablet Commonly known as: VALIUM Take 1 tablet (10 mg total) by mouth at bedtime as needed for sleep.   hydrOXYzine 50 MG tablet Commonly known as: ATARAX/VISTARIL Take 1 tablet (50 mg total) by mouth 3 (three) times daily as needed for itching.   OVER THE COUNTER MEDICATION Biotin Apple Cider Vinegar 2500 per serving woman's one a day Luten for eyes ASA 81 mg Cranberry 500 mg b12 1039mcg Move free Glucosamine and chondrition Calcium - 1200 mg   promethazine 25 MG tablet Commonly known as: PHENERGAN Take 1 tablet (25 mg total) by mouth every 8 (eight) hours as needed for nausea or vomiting.        ROS:  A comprehensive review of systems was negative except for: Gastrointestinal: positive for abdominal pain and nausea Musculoskeletal: positive for back pain, neck pain and joint pain  Blood pressure 140/89, pulse 84, temperature 98.3 F (36.8 C), temperature source Oral, resp. rate 16, height 5\' 8"  (1.727 m), weight  208 lb (94.3 kg), SpO2 95 %. Physical Exam Vitals reviewed.  Constitutional:      Appearance: Normal appearance.  HENT:     Head: Normocephalic.     Nose: Nose normal.     Mouth/Throat:     Mouth: Mucous membranes are moist.  Eyes:     Extraocular Movements: Extraocular movements intact.  Cardiovascular:     Rate and Rhythm: Normal rate and regular rhythm.  Pulmonary:     Effort: Pulmonary effort is normal.     Breath sounds: Normal breath sounds.  Abdominal:     General: There is no distension.     Palpations: Abdomen is soft.     Tenderness: There is abdominal tenderness in the right upper quadrant.  Musculoskeletal:        General: Normal range of motion.     Cervical back: No rigidity.  Skin:    General: Skin is warm and dry.  Neurological:     General: No focal deficit present.     Mental Status: She is alert and oriented to person, place,  and time.  Psychiatric:        Mood and Affect: Mood normal.        Behavior: Behavior normal.        Thought Content: Thought content normal.        Judgment: Judgment normal.     Results: Narrative & Impression  CLINICAL DATA:  RIGHT upper quadrant epigastric pain intermittently  EXAM: ABDOMEN ULTRASOUND COMPLETE  COMPARISON:  08/09/2013  FINDINGS: Gallbladder: Shadowing calculus in gallbladder 13 mm thick. Gallbladder poorly distended, cannot exclude additional calculi at lower gallbladder segment due to shadowing from observed stone. No pericholecystic fluid or sonographic Murphy sign.  Common bile duct: Diameter: 7 mm, borderline prominent  Liver: Echogenic parenchyma, likely fatty infiltration though this can be seen with cirrhosis and certain infiltrative disorders. No focal hepatic mass or nodularity visualized. Portal vein is patent on color Doppler imaging with normal direction of blood flow towards the liver.  IVC: Normal appearance  Pancreas: Normal appearance  Spleen: Normal appearance, 10.0 cm length  Right Kidney: Length: 10.2 cm. Normal morphology without mass or hydronephrosis.  Left Kidney: Length: 10.9 cm. Normal morphology without mass or hydronephrosis.  Abdominal aorta: Normal caliber  Other findings: No free fluid  IMPRESSION: Contracted gallbladder containing a 13 mm diameter calculus.  Gallbladder wall borderline prominent though this could be related to underdistention.  No definite scintigraphic evidence of acute cholecystitis.  Probable fatty infiltration of liver as above.  Borderline prominent CBD 7 mm diameter, correlate with LFTs.   Electronically Signed   By: Lavonia Dana M.D.   On: 07/28/2020 11:18   Results for Andrea Miles, Andrea Miles (MRN [id number]) as of 08/10/2020 14:09  Ref. Range 07/20/2020 12:08  Sodium Latest Ref Range: 134 - 144 mmol/L 141  Potassium Latest Ref Range: 3.5 - 5.2 mmol/L 3.8  Chloride  Latest Ref Range: 96 - 106 mmol/L 98  CO2 Latest Ref Range: 20 - 29 mmol/L 27  Glucose Latest Ref Range: 65 - 99 mg/dL 101 (H)  BUN Latest Ref Range: 8 - 27 mg/dL 16  Creatinine Latest Ref Range: 0.57 - 1.00 mg/dL 1.05 (H)  Calcium Latest Ref Range: 8.7 - 10.3 mg/dL 10.3  BUN/Creatinine Ratio Latest Ref Range: 12 - 28  15  Alkaline Phosphatase Latest Ref Range: 48 - 121 IU/L 101  Albumin Latest Ref Range: 3.8 - 4.8 g/dL 4.9 (H)  Lipase Latest Ref Range:  14 - 72 U/L 28  AST Latest Ref Range: 0 - 40 IU/L 34  ALT Latest Ref Range: 0 - 32 IU/L 22  Total Protein Latest Ref Range: 6.0 - 8.5 g/dL 8.3  Total Bilirubin Latest Ref Range: 0.0 - 1.2 mg/dL 0.8  GFR, Est Non African American Latest Ref Range: >59 mL/min/1.73 56 (L)  GFR, Est African American Latest Ref Range: >59 mL/min/1.73 64  BILIRUBIN, DIRECT Latest Ref Range: 0.00 - 0.40 mg/dL 0.21    Assessment & Plan:  Andrea Miles is a 64 y.o. female with possible symptomatic cholelithiasis with long standing RUQ pain and possible some degree of chronic cholecystitis now given the contraction of the gallbladder. She wants to proceed with removal. We discussed that since her symptoms are not classic that this may not resolve her pain. We discussed that she is getting an EGD with GI also which is good. She would like surgery the week of 9/20 but I am on vacation. She is in agreement with proceeding with Dr. Arnoldo Morale during her surgery.   PLAN: I counseled the patient about the indication, risks and benefits of laparoscopic cholecystectomy.  She understands there is a very small chance for bleeding, infection, injury to normal structures (including common bile duct), conversion to open surgery, persistent symptoms, evolution of postcholecystectomy diarrhea, need for secondary interventions, anesthesia reaction, cardiopulmonary issues and other risks not specifically detailed here. I described the expected recovery, the plan for follow-up and the  restrictions during the recovery phase.  All questions were answered.  COVID preop test discussed   All questions were answered to the satisfaction of the patient.   Virl Cagey 08/10/2020, 2:09 PM

## 2020-08-15 ENCOUNTER — Other Ambulatory Visit: Payer: Self-pay | Admitting: *Deleted

## 2020-08-15 ENCOUNTER — Telehealth: Payer: Self-pay | Admitting: Family Medicine

## 2020-08-15 MED ORDER — KETOCONAZOLE 2 % EX CREA: 1.0000 "application " | TOPICAL_CREAM | Freq: Two times a day (BID) | CUTANEOUS | 2 refills | Status: DC

## 2020-08-15 NOTE — Telephone Encounter (Signed)
Med sent to pharm and pt was notified.  °

## 2020-08-15 NOTE — Telephone Encounter (Signed)
Ketoconazole apply twice daily as needed 30 g with 2 refills

## 2020-08-15 NOTE — Telephone Encounter (Signed)
Pt contacted and states that the rash under breast has been going on and was seen when pt was here for physical. Pt states that provider was going to call it in but forgot to due to provider being busy. Please advise. Thank you  [employer]

## 2020-08-15 NOTE — Telephone Encounter (Signed)
Pt checking status of cream being sent to [facility] for yeast infection under her breast.

## 2020-08-18 ENCOUNTER — Ambulatory Visit (HOSPITAL_COMMUNITY)
Admission: RE | Admit: 2020-08-18 | Discharge: 2020-08-18 | Disposition: A | Discharge status: Home / Self Care | Payer: Medicare Other | Source: Ambulatory Visit | Attending: Family Medicine | Admitting: Family Medicine

## 2020-08-18 ENCOUNTER — Other Ambulatory Visit: Payer: Self-pay

## 2020-08-18 DIAGNOSIS — Z13820 Encounter for screening for osteoporosis: Secondary | ICD-10-CM | POA: Insufficient documentation

## 2020-08-18 DIAGNOSIS — Z780 Asymptomatic menopausal state: Secondary | ICD-10-CM | POA: Diagnosis not present

## 2020-08-18 DIAGNOSIS — Z1231 Encounter for screening mammogram for malignant neoplasm of breast: Secondary | ICD-10-CM | POA: Insufficient documentation

## 2020-08-24 ENCOUNTER — Encounter (HOSPITAL_COMMUNITY): Payer: Self-pay

## 2020-08-25 ENCOUNTER — Other Ambulatory Visit (HOSPITAL_COMMUNITY)
Admission: RE | Admit: 2020-08-25 | Discharge: 2020-08-25 | Disposition: A | Discharge status: Home / Self Care | Payer: Medicare Other | Source: Ambulatory Visit | Attending: General Surgery | Admitting: General Surgery

## 2020-08-25 ENCOUNTER — Other Ambulatory Visit: Payer: Self-pay

## 2020-08-25 ENCOUNTER — Encounter (HOSPITAL_COMMUNITY)
Admission: RE | Admit: 2020-08-25 | Discharge: 2020-08-25 | Disposition: A | Discharge status: Home / Self Care | Payer: Medicare Other | Source: Ambulatory Visit | Attending: General Surgery | Admitting: General Surgery

## 2020-08-25 DIAGNOSIS — Z01812 Encounter for preprocedural laboratory examination: Secondary | ICD-10-CM | POA: Insufficient documentation

## 2020-08-25 DIAGNOSIS — Z20822 Contact with and (suspected) exposure to covid-19: Secondary | ICD-10-CM | POA: Insufficient documentation

## 2020-08-25 HISTORY — DX: Other specified postprocedural states: Z98.890

## 2020-08-25 HISTORY — DX: Nausea with vomiting, unspecified: R11.2

## 2020-08-25 LAB — SARS CORONAVIRUS 2 (TAT 6-24 HRS): SARS Coronavirus 2: NEGATIVE

## 2020-08-25 NOTE — Patient Instructions (Signed)
How to Use Chlorhexidine for Bathing Chlorhexidine gluconate (CHG) is a germ-killing (antiseptic) solution that is used to clean the skin. It can get rid of the bacteria that normally live on the skin and can keep them away for about 24 hours. To clean your skin with CHG, you may be given:  A CHG solution to use in the shower or as part of a sponge bath.  A prepackaged cloth that contains CHG. Cleaning your skin with CHG may help lower the risk for infection:  While you are staying in the intensive care unit of the hospital.  If you have a vascular access, such as a central line, to provide short-term or long-term access to your veins.  If you have a catheter to drain urine from your bladder.  If you are on a ventilator. A ventilator is a machine that helps you breathe by moving air in and out of your lungs.  After surgery. What are the risks? Risks of using CHG include:  A skin reaction.  Hearing loss, if CHG gets in your ears.  Eye injury, if CHG gets in your eyes and is not rinsed out.  The CHG product catching fire. Make sure that you avoid smoking and flames after applying CHG to your skin. Do not use CHG:  If you have a chlorhexidine allergy or have previously reacted to chlorhexidine.  On babies younger than 9 months of age. How to use CHG solution  Use CHG only as told by your health care provider, and follow the instructions on the label.  Use the full amount of CHG as directed. Usually, this is one bottle. During a shower Follow these steps when using CHG solution during a shower (unless your health care provider gives you different instructions): 1. Start the shower. 2. Use your normal soap and shampoo to wash your face and hair. 3. Turn off the shower or move out of the shower stream. 4. Pour the CHG onto a clean washcloth. Do not use any type of brush or rough-edged sponge. 5. Starting at your neck, lather your body down to your toes. Make sure you follow  these instructions: ? If you will be having surgery, pay special attention to the part of your body where you will be having surgery. Scrub this area for at least 1 minute. ? Do not use CHG on your head or face. If the solution gets into your ears or eyes, rinse them well with water. ? Avoid your genital area. ? Avoid any areas of skin that have broken skin, cuts, or scrapes. ? Scrub your back and under your arms. Make sure to wash skin folds. 6. Let the lather sit on your skin for 1-2 minutes or as long as told by your health care provider. 7. Thoroughly rinse your entire body in the shower. Make sure that all body creases and crevices are rinsed well. 8. Dry off with a clean towel. Do not put any substances on your body afterward--such as powder, lotion, or perfume--unless you are told to do so by your health care provider. Only use lotions that are recommended by the manufacturer. 9. Put on clean clothes or pajamas. 10. If it is the night before your surgery, sleep in clean sheets.  During a sponge bath Follow these steps when using CHG solution during a sponge bath (unless your health care provider gives you different instructions): 1. Use your normal soap and shampoo to wash your face and hair. 2. Pour the CHG onto  a clean washcloth. 3. Starting at your neck, lather your body down to your toes. Make sure you follow these instructions: ? If you will be having surgery, pay special attention to the part of your body where you will be having surgery. Scrub this area for at least 1 minute. ? Do not use CHG on your head or face. If the solution gets into your ears or eyes, rinse them well with water. ? Avoid your genital area. ? Avoid any areas of skin that have broken skin, cuts, or scrapes. ? Scrub your back and under your arms. Make sure to wash skin folds. 4. Let the lather sit on your skin for 1-2 minutes or as long as told by your health care provider. 5. Using a different clean, wet  washcloth, thoroughly rinse your entire body. Make sure that all body creases and crevices are rinsed well. 6. Dry off with a clean towel. Do not put any substances on your body afterward--such as powder, lotion, or perfume--unless you are told to do so by your health care provider. Only use lotions that are recommended by the manufacturer. 7. Put on clean clothes or pajamas. 8. If it is the night before your surgery, sleep in clean sheets. How to use CHG prepackaged cloths  Only use CHG cloths as told by your health care provider, and follow the instructions on the label.  Use the CHG cloth on clean, dry skin.  Do not use the CHG cloth on your head or face unless your health care provider tells you to.  When washing with the CHG cloth: ? Avoid your genital area. ? Avoid any areas of skin that have broken skin, cuts, or scrapes. Before surgery Follow these steps when using a CHG cloth to clean before surgery (unless your health care provider gives you different instructions): 1. Using the CHG cloth, vigorously scrub the part of your body where you will be having surgery. Scrub using a back-and-forth motion for 3 minutes. The area on your body should be completely wet with CHG when you are done scrubbing. 2. Do not rinse. Discard the cloth and let the area air-dry. Do not put any substances on the area afterward, such as powder, lotion, or perfume. 3. Put on clean clothes or pajamas. 4. If it is the night before your surgery, sleep in clean sheets.  For general bathing Follow these steps when using CHG cloths for general bathing (unless your health care provider gives you different instructions). 1. Use a separate CHG cloth for each area of your body. Make sure you wash between any folds of skin and between your fingers and toes. Wash your body in the following order, switching to a new cloth after each step: ? The front of your neck, shoulders, and chest. ? Both of your arms, under your  arms, and your hands. ? Your stomach and groin area, avoiding the genitals. ? Your right leg and foot. ? Your left leg and foot. ? The back of your neck, your back, and your buttocks. 2. Do not rinse. Discard the cloth and let the area air-dry. Do not put any substances on your body afterward--such as powder, lotion, or perfume--unless you are told to do so by your health care provider. Only use lotions that are recommended by the manufacturer. 3. Put on clean clothes or pajamas. Contact a health care provider if:  Your skin gets irritated after scrubbing.  You have questions about using your solution or cloth. Get help  right away if:  Your eyes become very red or swollen.  Your eyes itch badly.  Your skin itches badly and is red or swollen.  Your hearing changes.  You have trouble seeing.  You have swelling or tingling in your mouth or throat.  You have trouble breathing.  You swallow any chlorhexidine. Summary  Chlorhexidine gluconate (CHG) is a germ-killing (antiseptic) solution that is used to clean the skin. Cleaning your skin with CHG may help to lower your risk for infection.  You may be given CHG to use for bathing. It may be in a bottle or in a prepackaged cloth to use on your skin. Carefully follow your health care provider's instructions and the instructions on the product label.  Do not use CHG if you have a chlorhexidine allergy.  Contact your health care provider if your skin gets irritated after scrubbing. This information is not intended to replace advice given to you by your health care provider. Make sure you discuss any questions you have with your health care provider. Document Revised: 02/11/2019 Document Reviewed: 10/23/2017 Elsevier Patient Education  [employer].

## 2020-08-28 ENCOUNTER — Ambulatory Visit (HOSPITAL_COMMUNITY): Payer: Medicare Other | Admitting: Anesthesiology

## 2020-08-28 ENCOUNTER — Ambulatory Visit (HOSPITAL_COMMUNITY)
Admission: RE | Admit: 2020-08-28 | Discharge: 2020-08-28 | Disposition: A | Discharge status: Home / Self Care | Payer: Medicare Other | Attending: General Surgery | Admitting: General Surgery

## 2020-08-28 ENCOUNTER — Encounter (HOSPITAL_COMMUNITY)
Admission: RE | Disposition: A | Discharge status: Home / Self Care | Payer: Self-pay | Source: Home / Self Care | Attending: General Surgery

## 2020-08-28 DIAGNOSIS — K8010 Calculus of gallbladder with chronic cholecystitis without obstruction: Secondary | ICD-10-CM | POA: Diagnosis present

## 2020-08-28 DIAGNOSIS — K8020 Calculus of gallbladder without cholecystitis without obstruction: Secondary | ICD-10-CM

## 2020-08-28 HISTORY — PX: CHOLECYSTECTOMY: SHX55

## 2020-08-28 SURGERY — LAPAROSCOPIC CHOLECYSTECTOMY
Anesthesia: General | Site: Abdomen

## 2020-08-28 MED ORDER — FENTANYL CITRATE (PF) 100 MCG/2ML IJ SOLN
INTRAMUSCULAR | Status: DC | PRN
Start: 2020-08-28 — End: 2020-08-28
  Administered 2020-08-28 (×5): 50 ug via INTRAVENOUS

## 2020-08-28 MED ORDER — LIDOCAINE 2% (20 MG/ML) 5 ML SYRINGE
INTRAMUSCULAR | Status: AC
  Filled 2020-08-28: qty 5

## 2020-08-28 MED ORDER — PROPOFOL 10 MG/ML IV BOLUS
INTRAVENOUS | Status: AC
  Filled 2020-08-28: qty 20

## 2020-08-28 MED ORDER — PROMETHAZINE HCL 50 MG PO TABS: 25.0000 mg | ORAL_TABLET | Freq: Four times a day (QID) | ORAL | 1 refills | Status: DC | PRN

## 2020-08-28 MED ORDER — CHLORHEXIDINE GLUCONATE 0.12 % MT SOLN
15.0000 mL | Freq: Once | OROMUCOSAL | Status: AC
  Administered 2020-08-28: 15 mL via OROMUCOSAL

## 2020-08-28 MED ORDER — ACETAMINOPHEN 10 MG/ML IV SOLN
1000.0000 mg | Freq: Once | INTRAVENOUS | Status: AC
  Administered 2020-08-28: 1000 mg via INTRAVENOUS
  Filled 2020-08-28: qty 100

## 2020-08-28 MED ORDER — SUGAMMADEX SODIUM 500 MG/5ML IV SOLN
INTRAVENOUS | Status: DC | PRN
  Administered 2020-08-28: 400 mg via INTRAVENOUS

## 2020-08-28 MED ORDER — PHENYLEPHRINE HCL (PRESSORS) 10 MG/ML IV SOLN
INTRAVENOUS | Status: DC | PRN
  Administered 2020-08-28 (×4): 100 ug via INTRAVENOUS

## 2020-08-28 MED ORDER — HEMOSTATIC AGENTS (NO CHARGE) OPTIME
TOPICAL | Status: DC | PRN
  Administered 2020-08-28: 1 via TOPICAL

## 2020-08-28 MED ORDER — FENTANYL CITRATE (PF) 100 MCG/2ML IJ SOLN: 25.0000 ug | INTRAMUSCULAR | Status: DC | PRN

## 2020-08-28 MED ORDER — CHLORHEXIDINE GLUCONATE CLOTH 2 % EX PADS: 6.0000 | MEDICATED_PAD | Freq: Once | CUTANEOUS | Status: DC

## 2020-08-28 MED ORDER — ORAL CARE MOUTH RINSE: 15.0000 mL | Freq: Once | OROMUCOSAL | Status: AC

## 2020-08-28 MED ORDER — SCOPOLAMINE 1 MG/3DAYS TD PT72
MEDICATED_PATCH | TRANSDERMAL | Status: AC
  Filled 2020-08-28: qty 1

## 2020-08-28 MED ORDER — SODIUM CHLORIDE 0.9 % IV SOLN
2.0000 g | INTRAVENOUS | Status: AC
  Administered 2020-08-28: 2 g via INTRAVENOUS
  Filled 2020-08-28: qty 2

## 2020-08-28 MED ORDER — FENTANYL CITRATE (PF) 250 MCG/5ML IJ SOLN
INTRAMUSCULAR | Status: AC
Start: 2020-08-28 — End: ?
  Filled 2020-08-28: qty 5

## 2020-08-28 MED ORDER — PROMETHAZINE HCL 25 MG/ML IJ SOLN
6.2500 mg | INTRAMUSCULAR | Status: DC | PRN
  Administered 2020-08-28: 6.25 mg via INTRAVENOUS
  Filled 2020-08-28: qty 1

## 2020-08-28 MED ORDER — ONDANSETRON HCL 4 MG/2ML IJ SOLN
INTRAMUSCULAR | Status: DC | PRN
  Administered 2020-08-28: 4 mg via INTRAVENOUS

## 2020-08-28 MED ORDER — ROCURONIUM BROMIDE 100 MG/10ML IV SOLN
INTRAVENOUS | Status: DC | PRN
  Administered 2020-08-28: 50 mg via INTRAVENOUS

## 2020-08-28 MED ORDER — PROPOFOL 10 MG/ML IV BOLUS
INTRAVENOUS | Status: DC | PRN
  Administered 2020-08-28: 150 mg via INTRAVENOUS

## 2020-08-28 MED ORDER — MIDAZOLAM HCL 2 MG/2ML IJ SOLN
INTRAMUSCULAR | Status: AC
  Filled 2020-08-28: qty 2

## 2020-08-28 MED ORDER — METOCLOPRAMIDE HCL 5 MG/ML IJ SOLN
INTRAMUSCULAR | Status: DC | PRN
  Administered 2020-08-28: 10 mg via INTRAVENOUS

## 2020-08-28 MED ORDER — SODIUM CHLORIDE FLUSH 0.9 % IV SOLN
INTRAVENOUS | Status: AC
  Filled 2020-08-28: qty 10

## 2020-08-28 MED ORDER — LACTATED RINGERS IV SOLN: INTRAVENOUS | Status: DC | PRN

## 2020-08-28 MED ORDER — MIDAZOLAM HCL 5 MG/5ML IJ SOLN
INTRAMUSCULAR | Status: DC | PRN
  Administered 2020-08-28 (×2): 1 mg via INTRAVENOUS

## 2020-08-28 MED ORDER — DEXAMETHASONE SODIUM PHOSPHATE 10 MG/ML IJ SOLN
INTRAMUSCULAR | Status: AC
  Filled 2020-08-28: qty 1

## 2020-08-28 MED ORDER — PHENYLEPHRINE 40 MCG/ML (10ML) SYRINGE FOR IV PUSH (FOR BLOOD PRESSURE SUPPORT)
PREFILLED_SYRINGE | INTRAVENOUS | Status: AC
  Filled 2020-08-28: qty 10

## 2020-08-28 MED ORDER — DIPHENHYDRAMINE HCL 50 MG/ML IJ SOLN
INTRAMUSCULAR | Status: DC | PRN
  Administered 2020-08-28: 25 mg via INTRAVENOUS

## 2020-08-28 MED ORDER — MEPERIDINE HCL 100 MG PO TABS
50.0000 mg | ORAL_TABLET | ORAL | 0 refills | Status: DC | PRN
Start: 2020-08-28 — End: 2021-01-02

## 2020-08-28 MED ORDER — LIDOCAINE HCL (CARDIAC) PF 100 MG/5ML IV SOSY
PREFILLED_SYRINGE | INTRAVENOUS | Status: DC | PRN
  Administered 2020-08-28: 50 mg via INTRAVENOUS

## 2020-08-28 MED ORDER — MEPERIDINE HCL 50 MG/ML IJ SOLN: 6.2500 mg | INTRAMUSCULAR | Status: DC | PRN

## 2020-08-28 MED ORDER — LACTATED RINGERS IV SOLN: Freq: Once | INTRAVENOUS | Status: AC

## 2020-08-28 MED ORDER — BUPIVACAINE LIPOSOME 1.3 % IJ SUSP
INTRAMUSCULAR | Status: DC | PRN
  Administered 2020-08-28: 20 mL

## 2020-08-28 MED ORDER — SODIUM CHLORIDE 0.9 % IR SOLN
Status: DC | PRN
  Administered 2020-08-28: 1000 mL

## 2020-08-28 MED ORDER — BUPIVACAINE LIPOSOME 1.3 % IJ SUSP
INTRAMUSCULAR | Status: AC
  Filled 2020-08-28: qty 20

## 2020-08-28 MED ORDER — SCOPOLAMINE 1 MG/3DAYS TD PT72
1.0000 | MEDICATED_PATCH | Freq: Once | TRANSDERMAL | Status: DC
  Administered 2020-08-28: 1.5 mg via TRANSDERMAL

## 2020-08-28 MED ORDER — DEXAMETHASONE SODIUM PHOSPHATE 10 MG/ML IJ SOLN
INTRAMUSCULAR | Status: DC | PRN
  Administered 2020-08-28: 4 mg via INTRAVENOUS

## 2020-08-28 MED ORDER — ONDANSETRON HCL 4 MG/2ML IJ SOLN
INTRAMUSCULAR | Status: AC
  Filled 2020-08-28: qty 2

## 2020-08-28 MED ORDER — MEPERIDINE HCL 50 MG/ML IJ SOLN
25.0000 mg | INTRAMUSCULAR | Status: DC | PRN
  Administered 2020-08-28: 25 mg via INTRAVENOUS
  Filled 2020-08-28: qty 1

## 2020-08-28 SURGICAL SUPPLY — 46 items
ADH SKN CLS APL DERMABOND .7 (GAUZE/BANDAGES/DRESSINGS) ×1
APL SRG 38 LTWT LNG FL B (MISCELLANEOUS)
APPLICATOR ARISTA FLEXITIP XL (MISCELLANEOUS) IMPLANT
APPLIER CLIP ROT 10 11.4 M/L (STAPLE) ×3
APR CLP MED LRG 11.4X10 (STAPLE) ×1
BAG RETRIEVAL 10 (BASKET) ×1
BAG RETRIEVAL 10MM (BASKET) ×1
CLIP APPLIE ROT 10 11.4 M/L (STAPLE) ×1 IMPLANT
CLOTH BEACON ORANGE TIMEOUT ST (SAFETY) ×3 IMPLANT
COVER LIGHT HANDLE STERIS (MISCELLANEOUS) ×6 IMPLANT
COVER WAND RF STERILE (DRAPES) ×3 IMPLANT
DERMABOND ADVANCED (GAUZE/BANDAGES/DRESSINGS) ×2
DERMABOND ADVANCED .7 DNX12 (GAUZE/BANDAGES/DRESSINGS) ×1 IMPLANT
DISSECTOR BLUNT TIP ENDO 5MM (MISCELLANEOUS) ×3 IMPLANT
DURAPREP 26ML APPLICATOR (WOUND CARE) ×3 IMPLANT
ELECT REM PT RETURN 9FT ADLT (ELECTROSURGICAL) ×3
ELECTRODE REM PT RTRN 9FT ADLT (ELECTROSURGICAL) ×1 IMPLANT
GLOVE BIOGEL PI IND STRL 7.0 (GLOVE) ×2 IMPLANT
GLOVE BIOGEL PI INDICATOR 7.0 (GLOVE) ×4
GLOVE SURG SS PI 7.5 STRL IVOR (GLOVE) ×3 IMPLANT
GOWN STRL REUS W/TWL LRG LVL3 (GOWN DISPOSABLE) ×9 IMPLANT
HEMOSTAT ARISTA ABSORB 3G PWDR (HEMOSTASIS) IMPLANT
HEMOSTAT SNOW SURGICEL 2X4 (HEMOSTASIS) ×3 IMPLANT
INST SET LAPROSCOPIC AP (KITS) ×3 IMPLANT
KIT TURNOVER KIT A (KITS) ×3 IMPLANT
MANIFOLD NEPTUNE II (INSTRUMENTS) ×3 IMPLANT
NEEDLE HYPO 18GX1.5 BLUNT FILL (NEEDLE) ×3 IMPLANT
NEEDLE HYPO 22GX1.5 SAFETY (NEEDLE) ×3 IMPLANT
NEEDLE INSUFFLATION 14GA 120MM (NEEDLE) ×3 IMPLANT
NS IRRIG 1000ML POUR BTL (IV SOLUTION) ×3 IMPLANT
PACK LAP CHOLE LZT030E (CUSTOM PROCEDURE TRAY) ×3 IMPLANT
PAD ARMBOARD 7.5X6 YLW CONV (MISCELLANEOUS) ×3 IMPLANT
SET BASIN LINEN APH (SET/KITS/TRAYS/PACK) ×3 IMPLANT
SET TUBE SMOKE EVAC HIGH FLOW (TUBING) ×3 IMPLANT
SLEEVE ENDOPATH XCEL 5M (ENDOMECHANICALS) ×3 IMPLANT
SUT MNCRL AB 4-0 PS2 18 (SUTURE) ×6 IMPLANT
SUT VICRYL 0 UR6 27IN ABS (SUTURE) ×3 IMPLANT
SYR 20ML LL LF (SYRINGE) ×6 IMPLANT
SYS BAG RETRIEVAL 10MM (BASKET) ×1
SYSTEM BAG RETRIEVAL 10MM (BASKET) ×1 IMPLANT
TROCAR ENDO BLADELESS 11MM (ENDOMECHANICALS) ×3 IMPLANT
TROCAR XCEL NON-BLD 5MMX100MML (ENDOMECHANICALS) ×3 IMPLANT
TROCAR XCEL UNIV SLVE 11M 100M (ENDOMECHANICALS) ×3 IMPLANT
TUBE CONNECTING 12'X1/4 (SUCTIONS) ×1
TUBE CONNECTING 12X1/4 (SUCTIONS) ×2 IMPLANT
WARMER LAPAROSCOPE (MISCELLANEOUS) ×3 IMPLANT

## 2020-08-28 NOTE — Transfer of Care (Signed)
Immediate Anesthesia Transfer of Care Note  Patient: Andrea Miles  Procedure(s) Performed: LAPAROSCOPIC CHOLECYSTECTOMY (N/A Abdomen)  Patient Location: PACU  Anesthesia Type:general  Level of Consciousness: awake, drowsy and patient cooperative  Airway & Oxygen Therapy: Patient Spontanous Breathing and Patient connected to face mask oxygen  Post-op Assessment: Report given to RN, Post -op Vital signs reviewed and stable and Patient moving all extremities X 4  Post vital signs: Reviewed and stable  Last Vitals:  Vitals Value Taken Time  BP 144/79 08/28/20 0845  Temp    Pulse 72 08/28/20 0845  Resp 19 08/28/20 0845  SpO2 99 % 08/28/20 0845  Vitals shown include unvalidated device data.  Last Pain:  Vitals:   08/28/20 0649  PainSc: 5          Complications: No complications documented.

## 2020-08-28 NOTE — Anesthesia Procedure Notes (Signed)
Procedure Name: Intubation Date/Time: 08/28/2020 7:41 AM Performed by: Jonna Munro, CRNA Pre-anesthesia Checklist: Patient identified, Emergency Drugs available, Suction available, Patient being monitored and Timeout performed Patient Re-evaluated:Patient Re-evaluated prior to induction Oxygen Delivery Method: Circle system utilized Preoxygenation: Pre-oxygenation with 100% oxygen Induction Type: IV induction Laryngoscope Size: Mac and 3 Grade View: Grade I Tube type: Oral Number of attempts: 1 Airway Equipment and Method: Stylet Placement Confirmation: positive ETCO2,  ETT inserted through vocal cords under direct vision and breath sounds checked- equal and bilateral Secured at: 21 cm Tube secured with: Tape Dental Injury: Teeth and Oropharynx as per pre-operative assessment

## 2020-08-28 NOTE — Interval H&P Note (Signed)
History and Physical Interval Note:  08/28/2020 7:18 AM  Andrea Miles  has presented today for surgery, with the diagnosis of Cholelithiasis.  The various methods of treatment have been discussed with the patient and family. After consideration of risks, benefits and other options for treatment, the patient has consented to  Procedure(s): LAPAROSCOPIC CHOLECYSTECTOMY (N/A) as a surgical intervention.  The patient's history has been reviewed, patient examined, no change in status, stable for surgery.  I have reviewed the patient's chart and labs.  Questions were answered to the patient's satisfaction.     Aviva Signs

## 2020-08-28 NOTE — Anesthesia Postprocedure Evaluation (Signed)
Anesthesia Post Note  Patient: Andrea Miles  Procedure(s) Performed: LAPAROSCOPIC CHOLECYSTECTOMY (N/A Abdomen)  Patient location during evaluation: PACU Anesthesia Type: General Level of consciousness: awake, patient cooperative and sedated Pain management: satisfactory to patient Vital Signs Assessment: post-procedure vital signs reviewed and stable Respiratory status: spontaneous breathing, respiratory function stable and nonlabored ventilation Cardiovascular status: stable Postop Assessment: no apparent nausea or vomiting Anesthetic complications: no   No complications documented.   Last Vitals:  Vitals:   08/28/20 0649 08/28/20 0845  BP: (!) 153/89 (!) 144/79  Pulse: 82 70  Resp: 20 18  Temp: 36.7 C (P) 36.6 C  SpO2: 98% 99%    Last Pain:  Vitals:   08/28/20 0649  PainSc: 5                  Harlie Ragle

## 2020-08-28 NOTE — Op Note (Signed)
Patient:  Andrea Miles  DOB:  October 23, 1956  MRN:  [id number]   Preop Diagnosis: Biliary colic, cholelithiasis  Postop Diagnosis: Same  Procedure: Laparoscopic cholecystectomy  Surgeon: Aviva Signs, MD  Anes: General endotracheal  Indications: Patient is a 64 year old white female who presents with biliary colic secondary to cholelithiasis.  The risks and benefits of the procedure including bleeding, infection, hepatobiliary injury, and the possibility of an open procedure were fully explained to the patient, who gave informed consent.  Procedure note: The patient was placed in the supine position.  After induction of general endotracheal anesthesia, the abdomen was prepped and draped using the usual sterile technique with ChloraPrep.  Surgical site confirmation was performed.  A supraumbilical incision was made down to the fascia.  A Veress needle was introduced into the abdominal cavity and confirmation of placement was done using the saline drop test.  The abdomen was then insufflated to 15 mmHg pressure.  An 11 mm trocar was introduced into the abdominal cavity under direct visualization without difficulty.  The patient was placed in reverse Trendelenburg position and an additional 11 mm trocar was placed in the epigastric region and 5 mm trochars were placed the right upper quadrant and right flank regions.  Liver was inspected and noted to be within normal limits.  The gallbladder was noted to be contracted, smaller in size, with a thick gallbladder wall.  The dissection was begun around the infundibulum of the gallbladder.  The gallbladder was retracted in a dynamic fashion in order to provide the best view of the hepatobiliary triangle.  There was a significant amount of scarring present in this region.  The gallbladder was bluntly dissected free from the liver bed and the common bile duct.  While this was performed, the cystic duct seemed to tear away from the gallbladder.  Due to  scarring, it appeared to be short in nature.  I was able to place 2 clips parallel to the tissue in order to not include the common bile duct.  A remnant cystic artery was found earlier and divided using Bovie electrocautery.  This was done high up on the gallbladder.  The gallbladder was then removed from the liver bed using Bovie electrocautery.  Gallbladder was delivered through the epigastric trocar site using an Endo Catch bag.  The gallbladder fossa was inspected and no bleeding or bile leakage was noted.  I did confirm that the 2 clips did not traverse tissue that would occlude the common bile duct.  Surgicel was placed in the gallbladder fossa.  All fluid and air were then evacuated from the abdominal cavity prior to removal of the trochars.  All wounds were irrigated with normal saline.  All wounds were injected with Exparel.  The supraumbilical fascia was reapproximated using an 0 Vicryl interrupted suture.  All skin incisions were closed using a 4-0 Monocryl subcuticular suture.  Dermabond was applied.  All tape and needle counts were correct at the end of the procedure.  The patient was extubated in the operating room and transferred to PACU in stable condition.  Complications: None  EBL: Minimal  Specimen: Gallbladder

## 2020-08-28 NOTE — Anesthesia Preprocedure Evaluation (Signed)
Anesthesia Evaluation  Patient identified by MRN, date of birth, ID band Patient awake    Reviewed: Allergy & Precautions, NPO status , Patient's Chart, lab work & pertinent test results  History of Anesthesia Complications (+) PONV and history of anesthetic complications (severe PONV)  Airway Mallampati: II  TM Distance: >3 FB Neck ROM: Full    Dental no notable dental hx. (+) Dental Advisory Given, Teeth Intact   Pulmonary neg pulmonary ROS,    Pulmonary exam normal breath sounds clear to auscultation       Cardiovascular Exercise Tolerance: Good Normal cardiovascular exam Rhythm:Regular Rate:Normal     Neuro/Psych negative neurological ROS  negative psych ROS   GI/Hepatic negative GI ROS, Cholecystitis    Endo/Other  negative endocrine ROS  Renal/GU Renal disease  negative genitourinary   Musculoskeletal negative musculoskeletal ROS (+)   Abdominal   Peds negative pediatric ROS (+)  Hematology negative hematology ROS (+)   Anesthesia Other Findings   Reproductive/Obstetrics negative OB ROS                           Anesthesia Physical Anesthesia Plan  ASA: II  Anesthesia Plan: General   Post-op Pain Management:    Induction: Intravenous  PONV Risk Score and Plan: 4 or greater and Ondansetron, Dexamethasone, Midazolam, Scopolamine patch - Pre-op, Diphenhydramine and Metaclopromide  Airway Management Planned: Oral ETT  Additional Equipment:   Intra-op Plan:   Post-operative Plan: Extubation in OR  Informed Consent: I have reviewed the patients History and Physical, chart, labs and discussed the procedure including the risks, benefits and alternatives for the proposed anesthesia with the patient or authorized representative who has indicated his/her understanding and acceptance.     Dental advisory given  Plan Discussed with: CRNA and Surgeon  Anesthesia Plan  Comments:       Anesthesia Quick Evaluation

## 2020-08-28 NOTE — Addendum Note (Signed)
Addendum  created 08/28/20 0911 by Denese Killings, MD   Order list changed

## 2020-08-28 NOTE — Discharge Instructions (Signed)
[facility] THE [place] [facility] UNTIL Friday September 01, 2020. DO NOT USE ANY ADDITIONAL NUMBING MEDICATIONS WITHOUT CONSULTING A PHYSICIAN    PLEASE REMOVE THE SCOPOLAMINE PATCH BEHIND YOUR RIGHT EAR ON Thursday August 31, 2020. IT HELPS WITH NAUSEA.  [facility] HANDS AFTER REMOVAL          Laparoscopic Cholecystectomy, Care After This sheet gives you information about how to care for yourself after your procedure. Your health care provider may also give you more specific instructions. If you have problems or questions, contact your health care provider. What can I expect after the procedure? After the procedure, it is common to have:  Pain at your incision sites. You will be given medicines to control this pain.  Mild nausea or vomiting.  Bloating and possible shoulder pain from the air-like gas that was used during the procedure. Follow these instructions at home: Incision care   Follow instructions from your health care provider about how to take care of your incisions. Make sure you: ? Wash your hands with soap and water before you change your bandage (dressing). If soap and water are not available, use hand sanitizer. ? Change your dressing as told by your health care provider. ? Leave stitches (sutures), skin glue, or adhesive strips in place. These skin closures may need to be in place for 2 weeks or longer. If adhesive strip edges start to loosen and curl up, you may trim the loose edges. Do not remove adhesive strips completely unless your health care provider tells you to do that.  Do not take baths, swim, or use a hot tub until your health care provider approves. Ask your health care provider if you can take showers. You may only be allowed to take sponge baths for bathing.  Check your incision area every day for signs of infection. Check for: ? More redness, swelling, or pain. ? More fluid or blood. ? Warmth. ? Pus or a bad smell. Activity  Do not drive  or use heavy machinery while taking prescription pain medicine.  Do not lift anything that is heavier than 10 lb (4.5 kg) until your health care provider approves.  Do not play contact sports until your health care provider approves.  Do not drive for 24 hours if you were given a medicine to help you relax (sedative).  Rest as needed. Do not return to work or school until your health care provider approves. General instructions  Take over-the-counter and prescription medicines only as told by your health care provider.  To prevent or treat constipation while you are taking prescription pain medicine, your health care provider may recommend that you: ? Drink enough fluid to keep your urine clear or pale yellow. ? Take over-the-counter or prescription medicines. ? Eat foods that are high in fiber, such as fresh fruits and vegetables, whole grains, and beans. ? Limit foods that are high in fat and processed sugars, such as fried and sweet foods. Contact a health care provider if:  You develop a rash.  You have more redness, swelling, or pain around your incisions.  You have more fluid or blood coming from your incisions.  Your incisions feel warm to the touch.  You have pus or a bad smell coming from your incisions.  You have a fever.  One or more of your incisions breaks open. Get help right away if:  You have trouble breathing.  You have chest pain.  You have increasing pain in your shoulders.  You faint or feel dizzy when you stand.  You have severe pain in your abdomen.  You have nausea or vomiting that lasts for more than one day.  You have leg pain. This information is not intended to replace advice given to you by your health care provider. Make sure you discuss any questions you have with your health care provider. Document Revised: 11/07/2017 Document Reviewed: 05/13/2016 Elsevier Patient Education  2020 [place] Anesthesia, Adult, Care  After This sheet gives you information about how to care for yourself after your procedure. Your health care provider may also give you more specific instructions. If you have problems or questions, contact your health care provider. What can I expect after the procedure? After the procedure, the following side effects are common:  Pain or discomfort at the IV site.  Nausea.  Vomiting.  Sore throat.  Trouble concentrating.  Feeling cold or chills.  Weak or tired.  Sleepiness and fatigue.  Soreness and body aches. These side effects can affect parts of the body that were not involved in surgery. Follow these instructions at home:  For at least 24 hours after the procedure:  Have a responsible adult stay with you. It is important to have someone help care for you until you are awake and alert.  Rest as needed.  Do not: ? Participate in activities in which you could fall or become injured. ? Drive. ? Use heavy machinery. ? Drink alcohol. ? Take sleeping pills or medicines that cause drowsiness. ? Make important decisions or sign legal documents. ? Take care of children on your own. Eating and drinking  Follow any instructions from your health care provider about eating or drinking restrictions.  When you feel hungry, start by eating small amounts of foods that are soft and easy to digest (bland), such as toast. Gradually return to your regular diet.  Drink enough fluid to keep your urine pale yellow.  If you vomit, rehydrate by drinking water, juice, or clear broth. General instructions  If you have sleep apnea, surgery and certain medicines can increase your risk for breathing problems. Follow instructions from your health care provider about wearing your sleep device: ? Anytime you are sleeping, including during daytime naps. ? While taking prescription pain medicines, sleeping medicines, or medicines that make you drowsy.  Return to your normal activities as told  by your health care provider. Ask your health care provider what activities are safe for you.  Take over-the-counter and prescription medicines only as told by your health care provider.  If you smoke, do not smoke without supervision.  Keep all follow-up visits as told by your health care provider. This is important. Contact a health care provider if:  You have nausea or vomiting that does not get better with medicine.  You cannot eat or drink without vomiting.  You have pain that does not get better with medicine.  You are unable to pass urine.  You develop a skin rash.  You have a fever.  You have redness around your IV site that gets worse. Get help right away if:  You have difficulty breathing.  You have chest pain.  You have blood in your urine or stool, or you vomit blood. Summary  After the procedure, it is common to have a sore throat or nausea. It is also common to feel tired.  Have a responsible adult stay with you for the first 24 hours after general anesthesia. It is  important to have someone help care for you until you are awake and alert.  When you feel hungry, start by eating small amounts of foods that are soft and easy to digest (bland), such as toast. Gradually return to your regular diet.  Drink enough fluid to keep your urine pale yellow.  Return to your normal activities as told by your health care provider. Ask your health care provider what activities are safe for you. This information is not intended to replace advice given to you by your health care provider. Make sure you discuss any questions you have with your health care provider. Document Revised: 11/28/2017 Document Reviewed: 07/11/2017 Elsevier Patient Education  [place].

## 2020-08-29 ENCOUNTER — Encounter (HOSPITAL_COMMUNITY): Payer: Self-pay | Admitting: General Surgery

## 2020-08-29 LAB — SURGICAL PATHOLOGY

## 2020-09-04 ENCOUNTER — Encounter (HOSPITAL_COMMUNITY): Payer: Self-pay | Admitting: Internal Medicine

## 2020-09-07 ENCOUNTER — Other Ambulatory Visit: Payer: Self-pay

## 2020-09-07 ENCOUNTER — Telehealth (INDEPENDENT_AMBULATORY_CARE_PROVIDER_SITE_OTHER): Payer: Medicare Other | Admitting: General Surgery

## 2020-09-07 ENCOUNTER — Encounter: Payer: Self-pay | Admitting: General Surgery

## 2020-09-07 DIAGNOSIS — Z09 Encounter for follow-up examination after completed treatment for conditions other than malignant neoplasm: Secondary | ICD-10-CM

## 2020-09-07 NOTE — Progress Notes (Signed)
Subjective:     [place] telephone visit performed.  Patient is status post laparoscopic cholecystectomy.  She states she is doing well.  Her preoperative symptoms have resolved. Objective:    There were no vitals taken for this visit.  General:  alert, cooperative and no distress  Final pathology consistent with diagnosis.     Assessment:    Doing well postoperatively.    Plan:   Patient was instructed to call the office should she have any further issues.  Total telephone time 1 minute.

## 2020-09-13 ENCOUNTER — Other Ambulatory Visit: Payer: Self-pay

## 2020-09-13 ENCOUNTER — Other Ambulatory Visit (HOSPITAL_COMMUNITY)
Admission: RE | Admit: 2020-09-13 | Discharge: 2020-09-13 | Disposition: A | Discharge status: Home / Self Care | Payer: Medicare Other | Source: Ambulatory Visit | Attending: Internal Medicine | Admitting: Internal Medicine

## 2020-09-13 DIAGNOSIS — Z01812 Encounter for preprocedural laboratory examination: Secondary | ICD-10-CM | POA: Insufficient documentation

## 2020-09-13 DIAGNOSIS — Z20822 Contact with and (suspected) exposure to covid-19: Secondary | ICD-10-CM | POA: Diagnosis not present

## 2020-09-13 LAB — SARS CORONAVIRUS 2 (TAT 6-24 HRS): SARS Coronavirus 2: NEGATIVE

## 2020-09-14 ENCOUNTER — Ambulatory Visit (HOSPITAL_COMMUNITY): Payer: Medicare Other | Admitting: Certified Registered"

## 2020-09-14 ENCOUNTER — Encounter (HOSPITAL_COMMUNITY)
Admission: RE | Disposition: A | Discharge status: Home / Self Care | Payer: Self-pay | Source: Home / Self Care | Attending: Internal Medicine

## 2020-09-14 ENCOUNTER — Other Ambulatory Visit: Payer: Self-pay

## 2020-09-14 ENCOUNTER — Ambulatory Visit (HOSPITAL_COMMUNITY)
Admission: RE | Admit: 2020-09-14 | Discharge: 2020-09-14 | Disposition: A | Discharge status: Home / Self Care | Payer: Medicare Other | Attending: Internal Medicine | Admitting: Internal Medicine

## 2020-09-14 ENCOUNTER — Encounter (HOSPITAL_COMMUNITY): Payer: Self-pay | Admitting: Internal Medicine

## 2020-09-14 DIAGNOSIS — Z801 Family history of malignant neoplasm of trachea, bronchus and lung: Secondary | ICD-10-CM | POA: Diagnosis not present

## 2020-09-14 DIAGNOSIS — K5730 Diverticulosis of large intestine without perforation or abscess without bleeding: Secondary | ICD-10-CM | POA: Diagnosis not present

## 2020-09-14 DIAGNOSIS — D124 Benign neoplasm of descending colon: Secondary | ICD-10-CM | POA: Insufficient documentation

## 2020-09-14 DIAGNOSIS — Z885 Allergy status to narcotic agent status: Secondary | ICD-10-CM | POA: Diagnosis not present

## 2020-09-14 DIAGNOSIS — Z79899 Other long term (current) drug therapy: Secondary | ICD-10-CM | POA: Diagnosis not present

## 2020-09-14 DIAGNOSIS — Z7984 Long term (current) use of oral hypoglycemic drugs: Secondary | ICD-10-CM | POA: Insufficient documentation

## 2020-09-14 DIAGNOSIS — Z9049 Acquired absence of other specified parts of digestive tract: Secondary | ICD-10-CM | POA: Insufficient documentation

## 2020-09-14 DIAGNOSIS — Z90710 Acquired absence of both cervix and uterus: Secondary | ICD-10-CM | POA: Insufficient documentation

## 2020-09-14 DIAGNOSIS — Z888 Allergy status to other drugs, medicaments and biological substances status: Secondary | ICD-10-CM | POA: Insufficient documentation

## 2020-09-14 DIAGNOSIS — Z87442 Personal history of urinary calculi: Secondary | ICD-10-CM | POA: Diagnosis not present

## 2020-09-14 DIAGNOSIS — Z803 Family history of malignant neoplasm of breast: Secondary | ICD-10-CM | POA: Insufficient documentation

## 2020-09-14 DIAGNOSIS — Z1211 Encounter for screening for malignant neoplasm of colon: Secondary | ICD-10-CM | POA: Diagnosis present

## 2020-09-14 DIAGNOSIS — K635 Polyp of colon: Secondary | ICD-10-CM | POA: Diagnosis not present

## 2020-09-14 DIAGNOSIS — Z882 Allergy status to sulfonamides status: Secondary | ICD-10-CM | POA: Insufficient documentation

## 2020-09-14 DIAGNOSIS — Z791 Long term (current) use of non-steroidal anti-inflammatories (NSAID): Secondary | ICD-10-CM | POA: Diagnosis not present

## 2020-09-14 HISTORY — PX: POLYPECTOMY: SHX5525

## 2020-09-14 HISTORY — PX: COLONOSCOPY WITH PROPOFOL: SHX5780

## 2020-09-14 SURGERY — COLONOSCOPY WITH PROPOFOL
Anesthesia: General

## 2020-09-14 MED ORDER — LACTATED RINGERS IV SOLN: INTRAVENOUS | Status: DC

## 2020-09-14 MED ORDER — CHLORHEXIDINE GLUCONATE CLOTH 2 % EX PADS: 6.0000 | MEDICATED_PAD | Freq: Once | CUTANEOUS | Status: DC

## 2020-09-14 MED ORDER — LIDOCAINE 2% (20 MG/ML) 5 ML SYRINGE
INTRAMUSCULAR | Status: AC
  Filled 2020-09-14: qty 35

## 2020-09-14 MED ORDER — PROPOFOL 500 MG/50ML IV EMUL
INTRAVENOUS | Status: DC | PRN
  Administered 2020-09-14: 100 mg via INTRAVENOUS
  Administered 2020-09-14: 150 ug/kg/min via INTRAVENOUS

## 2020-09-14 MED ORDER — GLYCOPYRROLATE 0.2 MG/ML IJ SOLN
INTRAMUSCULAR | Status: AC
  Filled 2020-09-14: qty 1

## 2020-09-14 MED ORDER — GLYCOPYRROLATE 0.2 MG/ML IJ SOLN
0.2000 mg | Freq: Once | INTRAMUSCULAR | Status: AC
  Administered 2020-09-14: 0.2 mg via INTRAVENOUS

## 2020-09-14 MED ORDER — PROPOFOL 10 MG/ML IV BOLUS
INTRAVENOUS | Status: AC
  Filled 2020-09-14: qty 280

## 2020-09-14 MED ORDER — LIDOCAINE VISCOUS HCL 2 % MT SOLN
15.0000 mL | Freq: Once | OROMUCOSAL | Status: AC
  Administered 2020-09-14: 15 mL via OROMUCOSAL

## 2020-09-14 MED ORDER — LIDOCAINE VISCOUS HCL 2 % MT SOLN
OROMUCOSAL | Status: AC
  Filled 2020-09-14: qty 15

## 2020-09-14 NOTE — Anesthesia Postprocedure Evaluation (Signed)
Anesthesia Post Note  Patient: Andrea Miles  Procedure(s) Performed: COLONOSCOPY WITH PROPOFOL (N/A ) POLYPECTOMY  Patient location during evaluation: Endoscopy Anesthesia Type: General Level of consciousness: awake, oriented, awake and alert and patient cooperative Pain management: pain level controlled Vital Signs Assessment: post-procedure vital signs reviewed and stable Respiratory status: spontaneous breathing, respiratory function stable and nonlabored ventilation Cardiovascular status: blood pressure returned to baseline and stable Postop Assessment: no headache and no backache Anesthetic complications: no   No complications documented.   Last Vitals:  Vitals:   09/14/20 0703  BP: (!) 157/92  Pulse: 92  Resp: (!) 22  Temp: 36.8 C  SpO2: 96%    Last Pain:  Vitals:   09/14/20 0743  TempSrc:   PainSc: 0-No pain                 Tacy Learn

## 2020-09-14 NOTE — Anesthesia Preprocedure Evaluation (Signed)
Anesthesia Evaluation  Patient identified by MRN, date of birth, ID band Patient awake    Reviewed: Allergy & Precautions, H&P , NPO status , Patient's Chart, lab work & pertinent test results, reviewed documented beta blocker date and time   History of Anesthesia Complications (+) PONV and history of anesthetic complications  Airway Mallampati: II  TM Distance: >3 FB Neck ROM: full    Dental no notable dental hx.    Pulmonary neg pulmonary ROS,    Pulmonary exam normal breath sounds clear to auscultation       Cardiovascular Exercise Tolerance: Good negative cardio ROS   Rhythm:regular Rate:Normal     Neuro/Psych negative neurological ROS  negative psych ROS   GI/Hepatic negative GI ROS, Neg liver ROS,   Endo/Other  negative endocrine ROS  Renal/GU negative Renal ROS  negative genitourinary   Musculoskeletal   Abdominal   Peds  Hematology negative hematology ROS (+)   Anesthesia Other Findings   Reproductive/Obstetrics negative OB ROS                             Anesthesia Physical Anesthesia Plan  ASA: II  Anesthesia Plan: General   Post-op Pain Management:    Induction:   PONV Risk Score and Plan: Propofol infusion  Airway Management Planned:   Additional Equipment:   Intra-op Plan:   Post-operative Plan:   Informed Consent: I have reviewed the patients History and Physical, chart, labs and discussed the procedure including the risks, benefits and alternatives for the proposed anesthesia with the patient or authorized representative who has indicated his/her understanding and acceptance.     Dental Advisory Given  Plan Discussed with: CRNA  Anesthesia Plan Comments:         Anesthesia Quick Evaluation

## 2020-09-14 NOTE — Discharge Instructions (Signed)
Colonoscopy Discharge Instructions  Read the instructions outlined below and refer to this sheet in the next few weeks. These discharge instructions provide you with general information on caring for yourself after you leave the hospital. Your doctor may also give you specific instructions. While your treatment has been planned according to the most current medical practices available, unavoidable complications occasionally occur. If you have any problems or questions after discharge, call Dr. Gala Romney at [phone]. ACTIVITY  You may resume your regular activity, but move at a slower pace for the next 24 hours.   Take frequent rest periods for the next 24 hours.   Walking will help get rid of the air and reduce the bloated feeling in your belly (abdomen).   No driving for 24 hours (because of the medicine (anesthesia) used during the test).    Do not sign any important legal documents or operate any machinery for 24 hours (because of the anesthesia used during the test).  NUTRITION  Drink plenty of fluids.   You may resume your normal diet as instructed by your doctor.   Begin with a light meal and progress to your normal diet. Heavy or fried foods are harder to digest and may make you feel sick to your stomach (nauseated).   Avoid alcoholic beverages for 24 hours or as instructed.  MEDICATIONS  You may resume your normal medications unless your doctor tells you otherwise.  WHAT YOU CAN EXPECT TODAY  Some feelings of bloating in the abdomen.   Passage of more gas than usual.   Spotting of blood in your stool or on the toilet paper.  IF YOU HAD POLYPS REMOVED DURING THE COLONOSCOPY:  No aspirin products for 7 days or as instructed.   No alcohol for 7 days or as instructed.   Eat a soft diet for the next 24 hours.  FINDING OUT THE RESULTS OF YOUR TEST Not all test results are available during your visit. If your test results are not back during the visit, make an appointment  with your caregiver to find out the results. Do not assume everything is normal if you have not heard from your caregiver or the medical facility. It is important for you to follow up on all of your test results.  SEEK IMMEDIATE MEDICAL ATTENTION IF:  You have more than a spotting of blood in your stool.   Your belly is swollen (abdominal distention).   You are nauseated or vomiting.   You have a temperature over 101.   You have abdominal pain or discomfort that is severe or gets worse throughout the day.   Colonoscopy and diverticulosis information provided  Further recommendations to follow pending review of pathology report  At patient request, I called Xitlali Kastens at [phone] -reviewed results.  PATIENT INSTRUCTIONS POST-ANESTHESIA  IMMEDIATELY FOLLOWING SURGERY:  Do not drive or operate machinery for the first twenty four hours after surgery.  Do not make any important decisions for twenty four hours after surgery or while taking narcotic pain medications or sedatives.  If you develop intractable nausea and vomiting or a severe headache please notify your doctor immediately.  FOLLOW-UP:  Please make an appointment with your surgeon as instructed. You do not need to follow up with anesthesia unless specifically instructed to do so.  WOUND CARE INSTRUCTIONS (if applicable):  Keep a dry clean dressing on the anesthesia/puncture wound site if there is drainage.  Once the wound has quit draining you may leave it open to air.  Generally you should leave the bandage intact for twenty four hours unless there is drainage.  If the epidural site drains for more than 36-48 hours please call the anesthesia department.  QUESTIONS?:  Please feel free to call your physician or the hospital operator if you have any questions, and they will be happy to assist you.      Colon Polyps  Polyps are tissue growths inside the body. Polyps can grow in many places, including the large intestine  (colon). A polyp may be a round bump or a mushroom-shaped growth. You could have one polyp or several. Most colon polyps are noncancerous (benign). However, some colon polyps can become cancerous over time. Finding and removing the polyps early can help prevent this. What are the causes? The exact cause of colon polyps is not known. What increases the risk? You are more likely to develop this condition if you:  Have a family history of colon cancer or colon polyps.  Are older than 40 or older than 45 if you are African American.  Have inflammatory bowel disease, such as ulcerative colitis or Crohn's disease.  Have certain hereditary conditions, such as: ? Familial adenomatous polyposis. ? Lynch syndrome. ? Turcot syndrome. ? Peutz-Jeghers syndrome.  Are overweight.  Smoke cigarettes.  Do not get enough exercise.  Drink too much alcohol.  Eat a diet that is high in fat and red meat and low in fiber.  Had childhood cancer that was treated with abdominal radiation. What are the signs or symptoms? Most polyps do not cause symptoms. If you have symptoms, they may include:  Blood coming from your rectum when having a bowel movement.  Blood in your stool. The stool may look dark red or black.  Abdominal pain.  A change in bowel habits, such as constipation or diarrhea. How is this diagnosed? This condition is diagnosed with a colonoscopy. This is a procedure in which a lighted, flexible scope is inserted into the anus and then passed into the colon to examine the area. Polyps are sometimes found when a colonoscopy is done as part of routine cancer screening tests. How is this treated? Treatment for this condition involves removing any polyps that are found. Most polyps can be removed during a colonoscopy. Those polyps will then be tested for cancer. Additional treatment may be needed depending on the results of testing. Follow these instructions at home: Lifestyle  Maintain a  healthy weight, or lose weight if recommended by your health care provider.  Exercise every day or as told by your health care provider.  Do not use any products that contain nicotine or tobacco, such as cigarettes and e-cigarettes. If you need help quitting, ask your health care provider.  If you drink alcohol, limit how much you have: ? 0-1 drink a day for women. ? 0-2 drinks a day for men.  Be aware of how much alcohol is in your drink. In the [place] one drink equals one 12 oz bottle of beer (355 mL), one 5 oz glass of wine (148 mL), or one 1 oz shot of hard liquor (44 mL). Eating and drinking   Eat foods that are high in fiber, such as fruits, vegetables, and whole grains.  Eat foods that are high in calcium and vitamin D, such as milk, cheese, yogurt, eggs, liver, fish, and broccoli.  Limit foods that are high in fat, such as fried foods and desserts.  Limit the amount of red meat and processed meat you eat, such  as hot dogs, sausage, bacon, and lunch meats. General instructions  Keep all follow-up visits as told by your health care provider. This is important. ? This includes having regularly scheduled colonoscopies. ? Talk to your health care provider about when you need a colonoscopy. Contact a health care provider if:  You have new or worsening bleeding during a bowel movement.  You have new or increased blood in your stool.  You have a change in bowel habits.  You lose weight for no known reason. Summary  Polyps are tissue growths inside the body. Polyps can grow in many places, including the colon.  Most colon polyps are noncancerous (benign), but some can become cancerous over time.  This condition is diagnosed with a colonoscopy.  Treatment for this condition involves removing any polyps that are found. Most polyps can be removed during a colonoscopy. This information is not intended to replace advice given to you by your health care provider. Make sure you  discuss any questions you have with your health care provider. Document Revised: 03/12/2018 Document Reviewed: 03/12/2018 Elsevier Patient Education  [place].   Diverticulosis  Diverticulosis is a condition that develops when small pouches (diverticula) form in the wall of the large intestine (colon). The colon is where water is absorbed and stool (feces) is formed. The pouches form when the inside layer of the colon pushes through weak spots in the outer layers of the colon. You may have a few pouches or many of them. The pouches usually do not cause problems unless they become inflamed or infected. When this happens, the condition is called diverticulitis. What are the causes? The cause of this condition is not known. What increases the risk? The following factors may make you more likely to develop this condition:  Being older than age 76. Your risk for this condition increases with age. Diverticulosis is rare among people younger than age 71. By age 54, many people have it.  Eating a low-fiber diet.  Having frequent constipation.  Being overweight.  Not getting enough exercise.  Smoking.  Taking over-the-counter pain medicines, like aspirin and ibuprofen.  Having a family history of diverticulosis. What are the signs or symptoms? In most people, there are no symptoms of this condition. If you do have symptoms, they may include:  Bloating.  Cramps in the abdomen.  Constipation or diarrhea.  Pain in the lower left side of the abdomen. How is this diagnosed? Because diverticulosis usually has no symptoms, it is most often diagnosed during an exam for other colon problems. The condition may be diagnosed by:  Using a flexible scope to examine the colon (colonoscopy).  Taking an X-ray of the colon after dye has been put into the colon (barium enema).  Having a CT scan. How is this treated? You may not need treatment for this condition. Your health care  provider may recommend treatment to prevent problems. You may need treatment if you have symptoms or if you previously had diverticulitis. Treatment may include:  Eating a high-fiber diet.  Taking a fiber supplement.  Taking a live bacteria supplement (probiotic).  Taking medicine to relax your colon. Follow these instructions at home: Medicines  Take over-the-counter and prescription medicines only as told by your health care provider.  If told by your health care provider, take a fiber supplement or probiotic. Constipation prevention Your condition may cause constipation. To prevent or treat constipation, you may need to:  Drink enough fluid to keep your urine pale yellow.  Take over-the-counter or prescription medicines.  Eat foods that are high in fiber, such as beans, whole grains, and fresh fruits and vegetables.  Limit foods that are high in fat and processed sugars, such as fried or sweet foods.  General instructions  Try not to strain when you have a bowel movement.  Keep all follow-up visits as told by your health care provider. This is important. Contact a health care provider if you:  Have pain in your abdomen.  Have bloating.  Have cramps.  Have not had a bowel movement in 3 days. Get help right away if:  Your pain gets worse.  Your bloating becomes very bad.  You have a fever or chills, and your symptoms suddenly get worse.  You vomit.  You have bowel movements that are bloody or black.  You have bleeding from your rectum. Summary  Diverticulosis is a condition that develops when small pouches (diverticula) form in the wall of the large intestine (colon).  You may have a few pouches or many of them.  This condition is most often diagnosed during an exam for other colon problems.  Treatment may include increasing the fiber in your diet, taking supplements, or taking medicines. This information is not intended to replace advice given to you by  your health care provider. Make sure you discuss any questions you have with your health care provider. Document Revised: 06/24/2019 Document Reviewed: 06/24/2019 Elsevier Patient Education  [place].

## 2020-09-14 NOTE — H&P (Signed)
@LOGO @   Primary Care Physician:  Kathyrn Drown, MD Primary Gastroenterologist:  Dr. Gala Romney  Pre-Procedure History & Physical: HPI:  Andrea Miles is a 64 y.o. female here for screening colonoscopy.  Negative exam 15 years ago.  Patient had cholelithiasis noted recently.  Since being seen in our office she went to see Dr. Arnoldo Morale and get her gallbladder out a little over 2 weeks ago.  Right upper quadrant pain has resolved.  No upper GI tract symptoms.  She took her prep and came today for colonoscopy unbeknownst to [place] she had her gallbladder out little over 2 weeks ago.  I spoke to Dr. Arnoldo Morale and he felt it was safe to proceed with a screening colonoscopy today per plan.  Right upper quadrant pain has resolved and she has no upper GI symptoms  Past Medical History:  Diagnosis Date  . Gallstones   . Kidney stones   . PONV (postoperative nausea and vomiting)     Past Surgical History:  Procedure Laterality Date  . ABDOMINAL HYSTERECTOMY     no cancer complete hysterectomy 2010  . APPENDECTOMY    . CHOLECYSTECTOMY N/A 08/28/2020   Procedure: LAPAROSCOPIC CHOLECYSTECTOMY;  Surgeon: Aviva Signs, MD;  Location: [facility];  Service: General;  Laterality: N/A;  . COLONOSCOPY  2008   Dr. Gala Romney: normal    Prior to Admission medications   Medication Sig Start Date End Date Taking? Authorizing Provider  APPLE CIDER VINEGAR PO Take 450 mg by mouth daily.   Yes [provider]  Biotin 2500 MCG CAPS Take 2,500 mcg by mouth daily.   Yes [provider]  diazepam (VALIUM) 10 MG tablet Take 1 tablet (10 mg total) by mouth at bedtime as needed for sleep. 07/20/20  Yes Luking, Elayne Snare, MD  ketoconazole (NIZORAL) 2 % cream Apply 1 application topically 2 (two) times daily. Patient taking differently: Apply 1 application topically daily as needed for irritation.  08/15/20  Yes Kathyrn Drown, MD  meperidine (DEMEROL) 100 MG tablet Take 0.5 tablets (50 mg total) by mouth every 4  (four) hours as needed for pain. 08/28/20  Yes Aviva Signs, MD  Multiple Vitamin (MULTIVITAMIN WITH MINERALS) TABS tablet Take 1 tablet by mouth daily.   Yes [provider]  multivitamin-lutein (OCUVITE-LUTEIN) CAPS capsule Take 1 capsule by mouth daily.   Yes [provider]  promethazine (PHENERGAN) 50 MG tablet Take 0.5 tablets (25 mg total) by mouth every 6 (six) hours as needed for nausea or vomiting. 08/28/20  Yes Aviva Signs, MD  vitamin B-12 (CYANOCOBALAMIN) 1000 MCG tablet Take 1,000 mcg by mouth daily.   Yes [provider]  aspirin EC 81 MG tablet Take 81 mg by mouth daily. Swallow whole.     [provider]  Ginkgo 60 MG TABS Take 60 mg by mouth daily.    [provider]  Glucosamine HCl 1500 MG TABS Take 1,500 mg by mouth daily.    [provider]  hydrOXYzine (ATARAX/VISTARIL) 50 MG tablet Take 1 tablet (50 mg total) by mouth 3 (three) times daily as needed for itching. 07/20/20   Kathyrn Drown, MD    Allergies as of 08/01/2020 - Review Complete 08/01/2020  Allergen Reaction Noted  . Amitriptyline  08/08/2013  . Codeine  08/08/2013  . Darvocet [propoxyphene n-acetaminophen]  08/08/2013  . Dolamide [chlorpropamide]  07/27/2014  . Oxycodone  08/08/2013  . Percocet [oxycodone-acetaminophen]  07/27/2014  . Sulfa antibiotics  08/08/2013  . Toradol [  ketorolac tromethamine]  08/08/2013    Family History  Problem Relation Age of Onset  . Cancer Other        breast  . Lung cancer Father   . Colon cancer Neg Hx   . Colon polyps Neg Hx     Social History   Socioeconomic History  . Marital status: Widowed    Spouse name: Not on file  . Number of children: Not on file  . Years of education: Not on file  . Highest education level: Not on file  Occupational History  . Not on file  Tobacco Use  . Smoking status: Never Smoker  . Smokeless tobacco: Never Used  Vaping Use  . Vaping Use: Never used  Substance and  Sexual Activity  . Alcohol use: No  . Drug use: No  . Sexual activity: Yes    Birth control/protection: Surgical  Other Topics Concern  . Not on file  Social History Narrative  . Not on file   Social Determinants of Health   Financial Resource Strain:   . Difficulty of Paying Living Expenses: Not on file  Food Insecurity:   . Worried About Charity fundraiser in the Last Year: Not on file  . Ran Out of Food in the Last Year: Not on file  Transportation Needs:   . Lack of Transportation (Medical): Not on file  . Lack of Transportation (Non-Medical): Not on file  Physical Activity:   . Days of Exercise per Week: Not on file  . Minutes of Exercise per Session: Not on file  Stress:   . Feeling of Stress : Not on file  Social Connections:   . Frequency of Communication with Friends and Family: Not on file  . Frequency of Social Gatherings with Friends and Family: Not on file  . Attends Religious Services: Not on file  . Active Member of Clubs or Organizations: Not on file  . Attends Archivist Meetings: Not on file  . Marital Status: Not on file  Intimate Partner Violence:   . Fear of Current or Ex-Partner: Not on file  . Emotionally Abused: Not on file  . Physically Abused: Not on file  . Sexually Abused: Not on file    Review of Systems: See HPI, otherwise negative ROS  Physical Exam: BP (!) 157/92   Pulse 92   Temp 98.2 F (36.8 C) (Oral)   Resp (!) 22   Ht 5\' 8"  (1.727 m)   SpO2 96%   BMI 31.63 kg/m  General:   Alert,  Well-developed, well-nourished, pleasant and cooperative in NAD Neck:  Supple; no masses or thyromegaly. No significant cervical adenopathy. Lungs:  Clear throughout to auscultation.   No wheezes, crackles, or rhonchi. No acute distress. Heart:  Regular rate and rhythm; no murmurs, clicks, rubs,  or gallops. Abdomen: Non-distended, normal bowel sounds.  Soft and nontender without appreciable mass or hepatosplenomegaly.  Pulses:  Normal  pulses noted. Extremities:  Without clubbing or edema.  Impression/Plan: 64 year old lady here for average rescreening colonoscopy.  Recent right upper quadrant pain resolves status post cholecystectomy.  Doing well from her gallbladder surgery.  I spoke to Dr. Arnoldo Morale.  He felt it would be fine to go ahead and proceed with a screening colonoscopy.  No EGD today as there is now no indication.  The risks, benefits, limitations, alternatives and imponderables have been reviewed with the patient. Questions have been answered. All parties are agreeable.      Notice: This  dictation was prepared with Dragon dictation along with smaller phrase technology. Any transcriptional errors that result from this process are unintentional and may not be corrected upon review.

## 2020-09-14 NOTE — Op Note (Signed)
[facility] Patient Name: Andrea Miles Procedure Date: 09/14/2020 7:39 AM MRN: [id number] Date of Birth: August 19, 1956 Attending MD: Norvel Richards , MD CSN: [id number] Age: 64 Admit Type: Outpatient Procedure:                Colonoscopy Indications:              Screening for colorectal malignant neoplasm Providers:                Norvel Richards, MD, Gwenlyn Fudge RN, RN,                            Teressa Senter Tech, Technician, Randa Spike,                            Technician Referring MD:              Medicines:                Propofol per Anesthesia Complications:            No immediate complications. Estimated Blood Loss:     Estimated blood loss was minimal. Procedure:                Pre-Anesthesia Assessment:                           - Prior to the procedure, a History and Physical                            was performed, and patient medications and                            allergies were reviewed. The patient's tolerance of                            previous anesthesia was also reviewed. The risks                            and benefits of the procedure and the sedation                            options and risks were discussed with the patient.                            All questions were answered, and informed consent                            was obtained. Prior Anticoagulants: The patient has                            taken no previous anticoagulant or antiplatelet                            agents. ASA Grade Assessment: II - A patient with  mild systemic disease. After reviewing the risks                            and benefits, the patient was deemed in                            satisfactory condition to undergo the procedure.                           After obtaining informed consent, the colonoscope                            was passed under direct vision. Throughout the                            procedure,  the patient's blood pressure, pulse, and                            oxygen saturations were monitored continuously. The                            CF-HQ190L ([id number]) scope was introduced through                            the anus and advanced to the the cecum, identified                            by appendiceal orifice and ileocecal valve. The                            colonoscopy was performed without difficulty. The                            patient tolerated the procedure well. The quality                            of the bowel preparation was adequate. Scope In: 7:47:00 AM Scope Out: 8:01:23 AM Scope Withdrawal Time: 0 hours 7 minutes 51 seconds  Total Procedure Duration: 0 hours 14 minutes 23 seconds  Findings:      The perianal and digital rectal examinations were normal.      A 5 mm polyp was found in the descending colon. The polyp was sessile.       The polyp was removed with a cold snare. Resection and retrieval were       complete. Estimated blood loss was minimal.      Scattered small-mouthed diverticula were found in the sigmoid colon and       descending colon.      The exam was otherwise without abnormality on direct and retroflexion       views. Impression:               - One 5 mm polyp in the descending colon, removed                            with  a cold snare. Resected and retrieved.                           - Diverticulosis in the sigmoid colon and in the                            descending colon.                           - The examination was otherwise normal on direct                            and retroflexion views. Moderate Sedation:      Moderate (conscious) sedation was personally administered by an       anesthesia professional. The following parameters were monitored: oxygen       saturation, heart rate, blood pressure, respiratory rate, EKG, adequacy       of pulmonary ventilation, and response to care. Recommendation:           - Patient has  a contact number available for                            emergencies. The signs and symptoms of potential                            delayed complications were discussed with the                            patient. Return to normal activities tomorrow.                            Written discharge instructions were provided to the                            patient.                           - Advance diet as tolerated.                           - Continue present medications.                           - Repeat colonoscopy date to be determined after                            pending pathology results are reviewed for                            surveillance based on pathology results.                           - Return to GI office after studies are complete. Procedure Code(s):        --- Professional ---  45385, Colonoscopy, flexible; with removal of                            tumor(s), polyp(s), or other lesion(s) by snare                            technique Diagnosis Code(s):        --- Professional ---                           Z12.11, Encounter for screening for malignant                            neoplasm of colon                           K63.5, Polyp of colon                           K57.30, Diverticulosis of large intestine without                            perforation or abscess without bleeding CPT copyright 2019 American [employer] Association. All rights reserved. The codes documented in this report are preliminary and upon coder review may  be revised to meet current compliance requirements. Cristopher Estimable. Angelito Hopping, MD Norvel Richards, MD 09/14/2020 8:15:33 AM This report has been signed electronically. Number of Addenda: 0

## 2020-09-14 NOTE — Transfer of Care (Signed)
Immediate Anesthesia Transfer of Care Note  Patient: Andrea Miles  Procedure(s) Performed: COLONOSCOPY WITH PROPOFOL (N/A ) POLYPECTOMY  Patient Location: Endoscopy Unit  Anesthesia Type:General  Level of Consciousness: awake, alert , oriented and patient cooperative  Airway & Oxygen Therapy: Patient Spontanous Breathing  Post-op Assessment: Report given to RN, Post -op Vital signs reviewed and stable and Patient moving all extremities  Post vital signs: Reviewed and stable  Last Vitals:  Vitals Value Taken Time  BP    Temp    Pulse    Resp    SpO2      Last Pain:  Vitals:   09/14/20 0743  TempSrc:   PainSc: 0-No pain         Complications: No complications documented.

## 2020-09-15 ENCOUNTER — Encounter: Payer: Self-pay | Admitting: Internal Medicine

## 2020-09-15 LAB — SURGICAL PATHOLOGY

## 2020-09-20 ENCOUNTER — Encounter (HOSPITAL_COMMUNITY): Payer: Self-pay | Admitting: Internal Medicine

## 2020-09-21 ENCOUNTER — Ambulatory Visit: Payer: PRIVATE HEALTH INSURANCE | Admitting: Gastroenterology

## 2020-12-05 LAB — CBC WITH DIFFERENTIAL/PLATELET
Basophils Absolute: 0.1 10*3/uL (ref 0.0–0.2)
Basos: 1 %
EOS (ABSOLUTE): 0.2 10*3/uL (ref 0.0–0.4)
Eos: 2 %
Hematocrit: 44.9 % (ref 34.0–46.6)
Hemoglobin: 14.8 g/dL (ref 11.1–15.9)
Immature Grans (Abs): 0 10*3/uL (ref 0.0–0.1)
Immature Granulocytes: 0 %
Lymphocytes Absolute: 3.6 10*3/uL — ABNORMAL HIGH (ref 0.7–3.1)
Lymphs: 43 %
MCH: 29.6 pg (ref 26.6–33.0)
MCHC: 33 g/dL (ref 31.5–35.7)
MCV: 90 fL (ref 79–97)
Monocytes Absolute: 0.4 10*3/uL (ref 0.1–0.9)
Monocytes: 5 %
Neutrophils Absolute: 4 10*3/uL (ref 1.4–7.0)
Neutrophils: 49 %
Platelets: 293 10*3/uL (ref 150–450)
RBC: 5 x10E6/uL (ref 3.77–5.28)
RDW: 12.5 % (ref 11.7–15.4)
WBC: 8.2 10*3/uL (ref 3.4–10.8)

## 2020-12-05 LAB — LIPID PANEL
Chol/HDL Ratio: 4.4 ratio (ref 0.0–4.4)
Cholesterol, Total: 172 mg/dL (ref 100–199)
HDL: 39 mg/dL — ABNORMAL LOW (ref >39)
LDL Chol Calc (NIH): 107 mg/dL — ABNORMAL HIGH (ref 0–99)
Triglycerides: 147 mg/dL (ref 0–149)
VLDL Cholesterol Cal: 26 mg/dL (ref 5–40)

## 2020-12-05 LAB — BASIC METABOLIC PANEL
BUN/Creatinine Ratio: 13 (ref 12–28)
BUN: 11 mg/dL (ref 8–27)
CO2: 23 mmol/L (ref 20–29)
Calcium: 9.6 mg/dL (ref 8.7–10.3)
Chloride: 107 mmol/L — ABNORMAL HIGH (ref 96–106)
Creatinine, Ser: 0.83 mg/dL (ref 0.57–1.00)
GFR calc Af Amer: 86 mL/min/{1.73_m2} (ref >59)
GFR calc non Af Amer: 74 mL/min/{1.73_m2} (ref >59)
Glucose: 106 mg/dL — ABNORMAL HIGH (ref 65–99)
Potassium: 3.7 mmol/L (ref 3.5–5.2)
Sodium: 144 mmol/L (ref 134–144)

## 2021-01-02 ENCOUNTER — Encounter: Payer: Self-pay | Admitting: Gastroenterology

## 2021-01-02 ENCOUNTER — Ambulatory Visit (INDEPENDENT_AMBULATORY_CARE_PROVIDER_SITE_OTHER): Payer: Medicare Other | Admitting: Gastroenterology

## 2021-01-02 ENCOUNTER — Ambulatory Visit: Payer: Medicare Other | Admitting: Gastroenterology

## 2021-01-02 ENCOUNTER — Telehealth: Payer: Self-pay | Admitting: *Deleted

## 2021-01-02 ENCOUNTER — Other Ambulatory Visit: Payer: Self-pay

## 2021-01-02 VITALS — BP 137/93 | HR 84 | Temp 97.5°F | Ht 68.0 in | Wt 202.6 lb

## 2021-01-02 DIAGNOSIS — R1013 Epigastric pain: Secondary | ICD-10-CM

## 2021-01-02 DIAGNOSIS — R1011 Right upper quadrant pain: Secondary | ICD-10-CM | POA: Diagnosis not present

## 2021-01-02 HISTORY — DX: Epigastric pain: R10.13

## 2021-01-02 NOTE — Progress Notes (Signed)
° °

## 2021-01-02 NOTE — Telephone Encounter (Signed)
Patient called back. She states she needed the last of February. Advised did not have anything. She will await March schedule. Will call once we receive this

## 2021-01-02 NOTE — Patient Instructions (Signed)
1. Upper endoscopy as scheduled. See separate instructions. 2. Please have your labs done at [facility]. We will be in touch with results.

## 2021-01-02 NOTE — Telephone Encounter (Signed)
LMOVM for pt to call back to schedule EGD with propofol, Dr. Gala Romney, ASA 2

## 2021-01-02 NOTE — Progress Notes (Signed)
Primary Care Physician: Kathyrn Drown, MD  Primary Gastroenterologist:  Garfield Cornea, MD   Chief Complaint  Patient presents with   RUQ pain    Had gallbladder removed last year, still having pain   Diarrhea    Comes/goes    HPI: Andrea Miles is a 65 y.o. female here for follow-up.  She was seen back in August 2021 to arrange for routine screening colonoscopy.  Ultrasound abdomen performed earlier that month showed contracted gallbladder containing 13 mm stone, gallbladder wall borderline prominent, probable fatty liver, borderline prominent CBD of 7 mm.  LFTs were normal.  She complained of longstanding right upper quadrant pain for years.  Dating back at least 2008.  She was seen by Roseanne Kaufman, NP in our office who felt like her symptoms were not typical biliary type symptoms.  Plans were made for EGD and colonoscopy.  She was also referred to general surgery per patient request.  Patient completed cholecystectomy in September 2021.  She had her colonoscopy in October 2021 and found to have a single tubular adenoma which was removed, diverticulosis.  Plans for next colonoscopy in 7 years.  EGD was not completed day of colonoscopy because it was felt that there was no indication as patient was reporting that her right upper quadrant pain was improved and also she was shortly postop and according to the patient her surgeon advised holding off on upper endoscopy.    States pain is improved but never went away. Used to be a 20 on a pain scale, now 5-6. Cut back on spicy foods, trying to eat healthy, has lost about 7 pounds.  Sometimes eating yogurt helps her pain.  Sometimes food aggravates it.  No heartburn. No dysphagia. Has frequent throat clearing. Pain is now entire upper abdomen. No n/v. Can get diarrhea with it. Sometimes if eat pain is better but also can make her have diarrhea. Diarrhea is not as bad as it was before diarrhea. No melena, brbpr.  Patient states she had an ulcer  when she was a kid.  States she had a horrible childhood.  At one point she did vomit blood and they presume she had a ulcer.   She is not taking any medication for gastritis or ulcer.  Prefers to hold off on medication until upper endoscopy.  Current Outpatient Medications  Medication Sig Dispense Refill   APPLE CIDER VINEGAR PO Take 450 mg by mouth daily.     aspirin EC 81 MG tablet Take 81 mg by mouth daily. Swallow whole.      Biotin 2500 MCG CAPS Take 2,500 mcg by mouth daily.     diazepam (VALIUM) 10 MG tablet Take 1 tablet (10 mg total) by mouth at bedtime as needed for sleep. 30 tablet 5   Ginkgo 60 MG TABS Take 60 mg by mouth daily.     Glucosamine HCl 1500 MG TABS Take 1,500 mg by mouth daily.     hydrOXYzine (ATARAX/VISTARIL) 50 MG tablet Take 1 tablet (50 mg total) by mouth 3 (three) times daily as needed for itching. 90 tablet 3   ketoconazole (NIZORAL) 2 % cream Apply 1 application topically 2 (two) times daily. (Patient taking differently: Apply 1 application topically daily as needed for irritation.) 30 g 2   Multiple Vitamin (MULTIVITAMIN WITH MINERALS) TABS tablet Take 1 tablet by mouth daily.     multivitamin-lutein (OCUVITE-LUTEIN) CAPS capsule Take 1 capsule by mouth daily.     vitamin  B-12 (CYANOCOBALAMIN) 1000 MCG tablet Take 1,000 mcg by mouth daily.     No current facility-administered medications for this visit.    Allergies as of 01/02/2021 - Review Complete 01/02/2021  Allergen Reaction Noted   Amitriptyline  08/08/2013   Codeine  08/08/2013   Darvocet [propoxyphene n-acetaminophen]  08/08/2013   Dolamide [chlorpropamide]  07/27/2014   Oxycodone  08/08/2013   Percocet [oxycodone-acetaminophen]  07/27/2014   Sulfa antibiotics Swelling 08/08/2013   Toradol [ketorolac tromethamine] Swelling 08/08/2013   Vicodin [hydrocodone-acetaminophen] Nausea And Vomiting 08/28/2020   Past Medical History:  Diagnosis Date   Gallstones    Kidney  stones    PONV (postoperative nausea and vomiting)    Past Surgical History:  Procedure Laterality Date   ABDOMINAL HYSTERECTOMY     no cancer complete hysterectomy 2010   APPENDECTOMY     CHOLECYSTECTOMY N/A 08/28/2020   Procedure: LAPAROSCOPIC CHOLECYSTECTOMY;  Surgeon: Aviva Signs, MD;  Location: [facility];  Service: General;  Laterality: N/A;   COLONOSCOPY  2008   Dr. Gala Romney: normal   COLONOSCOPY WITH PROPOFOL N/A 09/14/2020   Dr. Gala Romney: Single tubular adenoma removed, diverticulosis.  Next colonoscopy in 7 years.   POLYPECTOMY  09/14/2020   Procedure: POLYPECTOMY;  Surgeon: Daneil Dolin, MD;  Location: [facility] SU[facility];  Service: Endoscopy;;   Family History  Problem Relation Age of Onset   Cancer Other        breast   Lung cancer Father    Colon cancer Neg Hx    Colon polyps Neg Hx    Social History   Tobacco Use   Smoking status: Never Smoker   Smokeless tobacco: Never Used  Vaping Use   Vaping Use: Never used  Substance Use Topics   Alcohol use: No   Drug use: No    ROS:  General: Negative for anorexia, weight loss, fever, chills, fatigue, weakness. ENT: Negative for hoarseness, difficulty swallowing, nasal congestion. CV: Negative for chest pain, angina, palpitations, dyspnea on exertion, peripheral edema.  Respiratory: Negative for dyspnea at rest, dyspnea on exertion, cough, sputum, wheezing.  GI: See history of present illness. GU:  Negative for dysuria, hematuria, urinary incontinence, urinary frequency, nocturnal urination.  Endo: Negative for unusual weight change.    Physical Examination:   BP (!) 137/93    Pulse 84    Temp (!) 97.5 F (36.4 C)    Ht 5\' 8"  (1.727 m)    Wt 202 lb 9.6 oz (91.9 kg)    BMI 30.81 kg/m   General: Well-nourished, well-developed in no acute distress.  Eyes: No icterus. Mouth: Oropharyngeal mucosa moist and pink , no lesions erythema or exudate. Lungs: Clear to auscultation bilaterally.  Heart: Regular rate  and rhythm, no murmurs rubs or gallops.  Abdomen: Bowel sounds are normal, nontender, nondistended, no hepatosplenomegaly or masses, no abdominal bruits or hernia, no rebound or guarding.   Extremities: No lower extremity edema. No clubbing or deformities. Neuro: Alert and oriented x 4   Skin: Warm and dry, no jaundice.   Psych: Alert and cooperative, normal mood and affect.  Labs:  Lab Results  Component Value Date   CREATININE 0.83 12/04/2020   BUN 11 12/04/2020   NA 144 12/04/2020   K 3.7 12/04/2020   CL 107 (H) 12/04/2020   CO2 23 12/04/2020   Lab Results  Component Value Date   WBC 8.2 12/04/2020   HGB 14.8 12/04/2020   HCT 44.9 12/04/2020   MCV 90 12/04/2020  PLT 293 12/04/2020     Imaging Studies: No results found.  Impression/plan:  65 year old female presenting for follow-up for abdominal pain and intermittent diarrhea.  Initially she felt her pain had resolved after having her gallbladder removed but she notes persisting symptoms, although milder.  Pain has somewhat migrated from right upper quadrant to include the whole upper abdomen.  Certain foods can aggravate it.  Other foods make it better.  Sometimes associated with diarrhea although the symptoms are quite mild and she is not really concerned about those.  She had a somewhat prominent common bile duct on previous imaging, would be reasonable to recheck LFTs.  If elevated she may need MRCP to exclude common bile duct stone although I feel this is less likely.  Symptoms may be more related to gastritis or peptic ulcer disease.  We will plan on updating labs.  Schedule her for EGD with Dr. Gala Romney with propofol, ASA 2.  I have discussed the risks, alternatives, benefits with regards to but not limited to the risk of reaction to medication, bleeding, infection, perforation and the patient is agreeable to proceed. Written consent to be obtained.  She declines trial of PPI therapy.  She will call with any new or  worsening symptoms.

## 2021-01-03 LAB — COMPREHENSIVE METABOLIC PANEL
ALT: 19 IU/L (ref 0–32)
AST: 27 IU/L (ref 0–40)
Albumin/Globulin Ratio: 1.4 (ref 1.2–2.2)
Albumin: 4.6 g/dL (ref 3.8–4.8)
Alkaline Phosphatase: 82 IU/L (ref 44–121)
BUN/Creatinine Ratio: 13 (ref 12–28)
BUN: 12 mg/dL (ref 8–27)
Bilirubin Total: 0.6 mg/dL (ref 0.0–1.2)
CO2: 23 mmol/L (ref 20–29)
Calcium: 9.9 mg/dL (ref 8.7–10.3)
Chloride: 104 mmol/L (ref 96–106)
Creatinine, Ser: 0.89 mg/dL (ref 0.57–1.00)
GFR calc Af Amer: 79 mL/min/{1.73_m2} (ref >59)
GFR calc non Af Amer: 68 mL/min/{1.73_m2} (ref >59)
Globulin, Total: 3.3 g/dL (ref 1.5–4.5)
Glucose: 97 mg/dL (ref 65–99)
Potassium: 4 mmol/L (ref 3.5–5.2)
Sodium: 144 mmol/L (ref 134–144)
Total Protein: 7.9 g/dL (ref 6.0–8.5)

## 2021-01-03 LAB — CBC WITH DIFFERENTIAL/PLATELET
Basophils Absolute: 0.1 10*3/uL (ref 0.0–0.2)
Basos: 1 %
EOS (ABSOLUTE): 0.2 10*3/uL (ref 0.0–0.4)
Eos: 2 %
Hematocrit: 43.4 % (ref 34.0–46.6)
Hemoglobin: 15 g/dL (ref 11.1–15.9)
Immature Grans (Abs): 0 10*3/uL (ref 0.0–0.1)
Immature Granulocytes: 0 %
Lymphocytes Absolute: 3.8 10*3/uL — ABNORMAL HIGH (ref 0.7–3.1)
Lymphs: 40 %
MCH: 30.1 pg (ref 26.6–33.0)
MCHC: 34.6 g/dL (ref 31.5–35.7)
MCV: 87 fL (ref 79–97)
Monocytes Absolute: 0.6 10*3/uL (ref 0.1–0.9)
Monocytes: 6 %
Neutrophils Absolute: 4.9 10*3/uL (ref 1.4–7.0)
Neutrophils: 51 %
Platelets: 324 10*3/uL (ref 150–450)
RBC: 4.98 x10E6/uL (ref 3.77–5.28)
RDW: 12.1 % (ref 11.7–15.4)
WBC: 9.5 10*3/uL (ref 3.4–10.8)

## 2021-01-03 LAB — LIPASE: Lipase: 33 U/L (ref 14–72)

## 2021-01-03 NOTE — Telephone Encounter (Signed)
lmovm FOR PT

## 2021-01-04 NOTE — Progress Notes (Signed)
CC'ED TO PCP 

## 2021-01-09 NOTE — Telephone Encounter (Signed)
Patient returned call. She has been scheduled for 3/3 (am appt). Aware endo will call 3 days prior with arrival time. Also aware will need covid test appt. Advised will send all info to mychart. She voiced understanding

## 2021-01-10 ENCOUNTER — Other Ambulatory Visit: Payer: Self-pay | Admitting: Family Medicine

## 2021-02-01 ENCOUNTER — Encounter (HOSPITAL_COMMUNITY): Payer: Self-pay | Admitting: Internal Medicine

## 2021-02-07 ENCOUNTER — Encounter (HOSPITAL_COMMUNITY): Payer: Self-pay | Admitting: Anesthesiology

## 2021-02-07 ENCOUNTER — Other Ambulatory Visit (HOSPITAL_COMMUNITY)
Admission: RE | Admit: 2021-02-07 | Discharge: 2021-02-07 | Disposition: A | Discharge status: Home / Self Care | Payer: Medicare Other | Source: Ambulatory Visit | Attending: Internal Medicine | Admitting: Internal Medicine

## 2021-02-07 ENCOUNTER — Other Ambulatory Visit: Payer: Self-pay

## 2021-02-07 DIAGNOSIS — Z20822 Contact with and (suspected) exposure to covid-19: Secondary | ICD-10-CM | POA: Diagnosis not present

## 2021-02-07 DIAGNOSIS — Z01812 Encounter for preprocedural laboratory examination: Secondary | ICD-10-CM | POA: Insufficient documentation

## 2021-02-07 LAB — SARS CORONAVIRUS 2 (TAT 6-24 HRS): SARS Coronavirus 2: POSITIVE — AB

## 2021-02-08 ENCOUNTER — Telehealth: Payer: Self-pay | Admitting: Physician Assistant

## 2021-02-08 ENCOUNTER — Telehealth: Payer: Self-pay | Admitting: Internal Medicine

## 2021-02-08 NOTE — Telephone Encounter (Signed)
LMOVM for pt 

## 2021-02-08 NOTE — Progress Notes (Signed)
Patient's covid test resulted positive. Notified patient who denies any symptoms. Informed patient her procedure would need to be postponed. Dr. Gala Romney notified. Will notify the office once they open.

## 2021-02-08 NOTE — Telephone Encounter (Signed)
Melanie from Short Stay called to say patient was covid positive and will need to reschedule. She was on for today with Dr Gala Romney.

## 2021-02-08 NOTE — Telephone Encounter (Signed)
Patient called back. She states she will call back to r/s because she has to coordinate with her daughter.

## 2021-02-08 NOTE — Telephone Encounter (Signed)
Called to discuss with Reina Fuse about Covid symptoms and the use of sotrovimab, remdisivir or oral therapies for those with mild to moderate Covid symptoms and at a high risk of hospitalization.     Pt does not qualify as pt has asymptomatic infection. Isolation precautions discussed. Advised to contact back for consideration should they develop symptoms. Patient verbalized understanding.   The pt was tested for pre op screening. She is fully vaccinated and boosted. She has our number if she goes on to develop symptoms   Patient Active Problem List   Diagnosis Date Noted  . Abdominal pain, epigastric 01/02/2021  . Gallstones 08/14/2020  . RUQ pain 08/01/2020  . Encounter for screening colonoscopy 08/01/2020  . Psychophysiological insomnia 06/27/2018  . Morbid obesity ([place]) 07/29/2014  . 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

## 2021-02-15 ENCOUNTER — Telehealth: Payer: Self-pay | Admitting: Internal Medicine

## 2021-02-15 NOTE — Telephone Encounter (Signed)
LMOVM for pt 

## 2021-02-15 NOTE — Telephone Encounter (Signed)
Pt called to reschedule her procedure and was asking for it to be the 3rd week of April because that's the only time her daughter is off to bring her. Please advise. [phone]

## 2021-02-15 NOTE — Telephone Encounter (Signed)
Patient returned call. She has been rescheduled to 4/21, am appt. Aware will mail new instructions. Since she was covid + she will not be retested. Called endo and LMOVM with appt information.

## 2021-03-26 ENCOUNTER — Encounter (HOSPITAL_COMMUNITY): Payer: Self-pay | Admitting: Internal Medicine

## 2021-03-29 ENCOUNTER — Ambulatory Visit (HOSPITAL_COMMUNITY)
Admission: RE | Admit: 2021-03-29 | Discharge: 2021-03-29 | Disposition: A | Discharge status: Home / Self Care | Payer: Medicare Other | Attending: Internal Medicine | Admitting: Internal Medicine

## 2021-03-29 ENCOUNTER — Ambulatory Visit (HOSPITAL_COMMUNITY): Payer: Medicare Other | Admitting: Anesthesiology

## 2021-03-29 ENCOUNTER — Other Ambulatory Visit: Payer: Self-pay

## 2021-03-29 ENCOUNTER — Encounter (HOSPITAL_COMMUNITY): Payer: Self-pay | Admitting: Internal Medicine

## 2021-03-29 ENCOUNTER — Encounter (HOSPITAL_COMMUNITY)
Admission: RE | Disposition: A | Discharge status: Home / Self Care | Payer: Self-pay | Source: Home / Self Care | Attending: Internal Medicine

## 2021-03-29 DIAGNOSIS — R1013 Epigastric pain: Secondary | ICD-10-CM | POA: Insufficient documentation

## 2021-03-29 DIAGNOSIS — Z79899 Other long term (current) drug therapy: Secondary | ICD-10-CM | POA: Insufficient documentation

## 2021-03-29 DIAGNOSIS — Z801 Family history of malignant neoplasm of trachea, bronchus and lung: Secondary | ICD-10-CM | POA: Diagnosis not present

## 2021-03-29 DIAGNOSIS — Z8616 Personal history of COVID-19: Secondary | ICD-10-CM | POA: Diagnosis not present

## 2021-03-29 DIAGNOSIS — Z803 Family history of malignant neoplasm of breast: Secondary | ICD-10-CM | POA: Insufficient documentation

## 2021-03-29 DIAGNOSIS — Z9049 Acquired absence of other specified parts of digestive tract: Secondary | ICD-10-CM | POA: Insufficient documentation

## 2021-03-29 DIAGNOSIS — Z882 Allergy status to sulfonamides status: Secondary | ICD-10-CM | POA: Insufficient documentation

## 2021-03-29 DIAGNOSIS — Z888 Allergy status to other drugs, medicaments and biological substances status: Secondary | ICD-10-CM | POA: Diagnosis not present

## 2021-03-29 DIAGNOSIS — R1011 Right upper quadrant pain: Secondary | ICD-10-CM | POA: Diagnosis not present

## 2021-03-29 DIAGNOSIS — K2289 Other specified disease of esophagus: Secondary | ICD-10-CM | POA: Insufficient documentation

## 2021-03-29 DIAGNOSIS — Z885 Allergy status to narcotic agent status: Secondary | ICD-10-CM | POA: Insufficient documentation

## 2021-03-29 HISTORY — PX: ESOPHAGOGASTRODUODENOSCOPY (EGD) WITH PROPOFOL: SHX5813

## 2021-03-29 SURGERY — ESOPHAGOGASTRODUODENOSCOPY (EGD) WITH PROPOFOL
Anesthesia: General

## 2021-03-29 MED ORDER — PROPOFOL 10 MG/ML IV BOLUS
INTRAVENOUS | Status: AC
  Filled 2021-03-29: qty 40

## 2021-03-29 MED ORDER — PROPOFOL 500 MG/50ML IV EMUL
INTRAVENOUS | Status: DC | PRN
  Administered 2021-03-29: 150 ug/kg/min via INTRAVENOUS

## 2021-03-29 MED ORDER — LACTATED RINGERS IV SOLN: INTRAVENOUS | Status: DC

## 2021-03-29 MED ORDER — PROPOFOL 10 MG/ML IV BOLUS
INTRAVENOUS | Status: AC
  Filled 2021-03-29: qty 60

## 2021-03-29 MED ORDER — PROPOFOL 10 MG/ML IV BOLUS
INTRAVENOUS | Status: DC | PRN
  Administered 2021-03-29 (×2): 20 mg via INTRAVENOUS

## 2021-03-29 NOTE — Anesthesia Postprocedure Evaluation (Signed)
Anesthesia Post Note  Patient: Andrea Miles  Procedure(s) Performed: ESOPHAGOGASTRODUODENOSCOPY (EGD) WITH PROPOFOL (N/A )  Patient location during evaluation: Endoscopy Anesthesia Type: General Level of consciousness: awake and alert Pain management: pain level controlled Vital Signs Assessment: post-procedure vital signs reviewed and stable Respiratory status: spontaneous breathing Cardiovascular status: blood pressure returned to baseline and stable Postop Assessment: no apparent nausea or vomiting Anesthetic complications: no   No complications documented.   Last Vitals:  Vitals:   03/29/21 0720 03/29/21 0837  BP: (!) 145/79   Pulse:  66  Resp: 16 13  Temp: 36.5 C 36.7 C  SpO2: 98% 95%    Last Pain:  Vitals:   03/29/21 0837  TempSrc: Oral  PainSc: 8                  Mahir Prabhakar

## 2021-03-29 NOTE — Anesthesia Preprocedure Evaluation (Signed)
Anesthesia Evaluation  Patient identified by MRN, date of birth, ID band Patient awake    Reviewed: Allergy & Precautions, H&P , NPO status , Patient's Chart, lab work & pertinent test results, reviewed documented beta blocker date and time   Airway Mallampati: II  TM Distance: >3 FB Neck ROM: full    Dental no notable dental hx.    Pulmonary neg pulmonary ROS,    Pulmonary exam normal breath sounds clear to auscultation       Cardiovascular Exercise Tolerance: Good negative cardio ROS   Rhythm:regular Rate:Normal     Neuro/Psych negative neurological ROS  negative psych ROS   GI/Hepatic negative GI ROS, Neg liver ROS,   Endo/Other  negative endocrine ROS  Renal/GU negative Renal ROS  negative genitourinary   Musculoskeletal   Abdominal   Peds  Hematology negative hematology ROS (+)   Anesthesia Other Findings   Reproductive/Obstetrics negative OB ROS                             Anesthesia Physical Anesthesia Plan  ASA: II  Anesthesia Plan: General   Post-op Pain Management:    Induction:   PONV Risk Score and Plan: Propofol infusion  Airway Management Planned:   Additional Equipment:   Intra-op Plan:   Post-operative Plan:   Informed Consent: I have reviewed the patients History and Physical, chart, labs and discussed the procedure including the risks, benefits and alternatives for the proposed anesthesia with the patient or authorized representative who has indicated his/her understanding and acceptance.     Dental Advisory Given  Plan Discussed with: CRNA  Anesthesia Plan Comments:         Anesthesia Quick Evaluation

## 2021-03-29 NOTE — H&P (Signed)
@LOGO @   Primary Care Physician:  Kathyrn Drown, MD Primary Gastroenterologist:  Dr. Gala Romney  Pre-Procedure History & Physical: HPI:  Andrea Miles is a 65 y.o. female here for further evaluation of epigastric/right upper quadrant abdominal pain.  Gallbladder out.  LFTs normal. Denies dysphagia.  Symptoms unchanged. Past Medical History:  Diagnosis Date  . Gallstones   . Kidney stones   . PONV (postoperative nausea and vomiting)     Past Surgical History:  Procedure Laterality Date  . ABDOMINAL HYSTERECTOMY     no cancer complete hysterectomy 2010  . APPENDECTOMY    . CHOLECYSTECTOMY N/A 08/28/2020   Procedure: LAPAROSCOPIC CHOLECYSTECTOMY;  Surgeon: Aviva Signs, MD;  Location: [facility];  Service: General;  Laterality: N/A;  . COLONOSCOPY  2008   Dr. Gala Romney: normal  . COLONOSCOPY WITH PROPOFOL N/A 09/14/2020   Dr. Gala Romney: Single tubular adenoma removed, diverticulosis.  Next colonoscopy in 7 years.  Marland Kitchen POLYPECTOMY  09/14/2020   Procedure: POLYPECTOMY;  Surgeon: Daneil Dolin, MD;  Location: [facility];  Service: Endoscopy;;    Prior to Admission medications   Medication Sig Start Date End Date Taking? Authorizing Provider  APPLE CIDER VINEGAR PO Take 450 mg by mouth daily.   Yes [provider]  aspirin EC 81 MG tablet Take 81 mg by mouth daily. Swallow whole.    Yes [provider]  BIOTIN PO Take 1 tablet by mouth daily.   Yes [provider]  diazepam (VALIUM) 10 MG tablet Take 1 tablet (10 mg total) by mouth at bedtime as needed for sleep. 07/20/20  Yes Luking, Elayne Snare, MD  Ginkgo 60 MG TABS Take 60 mg by mouth daily.   Yes [provider]  Glucosamine HCl 1500 MG TABS Take 1,500 mg by mouth daily.   Yes [provider]  hydrOXYzine (ATARAX/VISTARIL) 50 MG tablet Take 1 tablet (50 mg total) by mouth 3 (three) times daily as needed for itching. Patient taking differently: Take 50 mg by mouth 3 (three) times daily as needed  (Allergies). 07/20/20  Yes Luking, Elayne Snare, MD  ketoconazole (NIZORAL) 2 % cream APPLY CREAM TOPICALLY TWICE DAILY Patient taking differently: Apply 1 application topically daily as needed for irritation. 01/10/21  Yes Kathyrn Drown, MD  Multiple Vitamin (MULTIVITAMIN WITH MINERALS) TABS tablet Take 1 tablet by mouth daily.   Yes [provider]  multivitamin-lutein (OCUVITE-LUTEIN) CAPS capsule Take 1 capsule by mouth daily.   Yes [provider]  Omega-3 Fatty Acids (OMEGA 3 PO) Take 250 mg by mouth daily.   Yes [provider]  vitamin B-12 (CYANOCOBALAMIN) 1000 MCG tablet Take 1,000 mcg by mouth daily.   Yes [provider]    Allergies as of 01/09/2021 - Review Complete 01/02/2021  Allergen Reaction Noted  . Amitriptyline  08/08/2013  . Codeine  08/08/2013  . Darvocet [propoxyphene n-acetaminophen]  08/08/2013  . Dolamide [chlorpropamide]  07/27/2014  . Oxycodone  08/08/2013  . Percocet [oxycodone-acetaminophen]  07/27/2014  . Sulfa antibiotics Swelling 08/08/2013  . Toradol [ketorolac tromethamine] Swelling 08/08/2013  . Vicodin [hydrocodone-acetaminophen] Nausea And Vomiting 08/28/2020    Family History  Problem Relation Age of Onset  . Cancer Other        breast  . Lung cancer Father   . Colon cancer Neg Hx   . Colon polyps Neg Hx     Social History   Socioeconomic History  . Marital status: Widowed    Spouse name: Not on  file  . Number of children: Not on file  . Years of education: Not on file  . Highest education level: Not on file  Occupational History  . Not on file  Tobacco Use  . Smoking status: Never Smoker  . Smokeless tobacco: Never Used  Vaping Use  . Vaping Use: Never used  Substance and Sexual Activity  . Alcohol use: No  . Drug use: No  . Sexual activity: Yes    Birth control/protection: Surgical  Other Topics Concern  . Not on file  Social History Narrative  . Not on file   Social Determinants of Health    Financial Resource Strain: Not on file  Food Insecurity: Not on file  Transportation Needs: Not on file  Physical Activity: Not on file  Stress: Not on file  Social Connections: Not on file  Intimate Partner Violence: Not on file    Review of Systems: See HPI, otherwise negative ROS  Physical Exam: BP (!) 145/79   Temp 97.7 F (36.5 C) (Oral)   Resp 16   Ht 5\' 8"  (1.727 m)   Wt 86.2 kg   SpO2 98%   BMI 28.89 kg/m  General:   Alert,  Well-developed, well-nourished, pleasant and cooperative in NAD Neck:  Supple; no masses or thyromegaly. No significant cervical adenopathy. Lungs:  Clear throughout to auscultation.   No wheezes, crackles, or rhonchi. No acute distress. Heart:  Regular rate and rhythm; no murmurs, clicks, rubs,  or gallops. Abdomen: Non-distended, normal bowel sounds.  Soft and nontender without appreciable mass or hepatosplenomegaly.  Pulses:  Normal pulses noted. Extremities:  Without clubbing or edema.  Impression/Plan: 65 year old lady with epigastric/right upper quadrant abdominal pain.  No typical reflux symptoms.  No dysphagia.  Declined PPI therapy empirically previously. I have offered her a diagnostic EGD today. The risks, benefits, limitations, alternatives and imponderables have been reviewed with the patient. Potential for esophageal dilation, biopsy, etc. have also been reviewed.  Questions have been answered. All parties agreeable.     Notice: This dictation was prepared with Dragon dictation along with smaller phrase technology. Any transcriptional errors that result from this process are unintentional and may not be corrected upon review.

## 2021-03-29 NOTE — Discharge Instructions (Signed)
EGD Discharge instructions Please read the instructions outlined below and refer to this sheet in the next few weeks. These discharge instructions provide you with general information on caring for yourself after you leave the hospital. Your doctor may also give you specific instructions. While your treatment has been planned according to the most current medical practices available, unavoidable complications occasionally occur. If you have any problems or questions after discharge, please call your doctor. ACTIVITY  You may resume your regular activity but move at a slower pace for the next 24 hours.   Take frequent rest periods for the next 24 hours.   Walking will help expel (get rid of) the air and reduce the bloated feeling in your abdomen.   No driving for 24 hours (because of the anesthesia (medicine) used during the test).   You may shower.   Do not sign any important legal documents or operate any machinery for 24 hours (because of the anesthesia used during the test).  NUTRITION  Drink plenty of fluids.   You may resume your normal diet.   Begin with a light meal and progress to your normal diet.   Avoid alcoholic beverages for 24 hours or as instructed by your caregiver.  MEDICATIONS  You may resume your normal medications unless your caregiver tells you otherwise.  WHAT YOU CAN EXPECT TODAY  You may experience abdominal discomfort such as a feeling of fullness or "gas" pains.  FOLLOW-UP  Your doctor will discuss the results of your test with you.  SEEK IMMEDIATE MEDICAL ATTENTION IF ANY OF THE FOLLOWING OCCUR:  Excessive nausea (feeling sick to your stomach) and/or vomiting.   Severe abdominal pain and distention (swelling).   Trouble swallowing.   Temperature over 101 F (37.8 C).   Rectal bleeding or vomiting of blood.   Mild acid reflux found today.  Trial of Protonix 40 mg pill take 1 before breakfast daily prescription provided  Office visit with  Neil Crouch in 4 to 6 weeks  At patient request, I called Sherida Dobkins at [phone] -reviewed results.

## 2021-03-29 NOTE — Op Note (Signed)
[facility] Patient Name: Andrea Miles Procedure Date: 03/29/2021 8:16 AM MRN: [id number] Date of Birth: 12-12-1955 Attending MD: Norvel Richards , MD CSN: [id number] Age: 65 Admit Type: Outpatient Procedure:                Upper GI endoscopy Indications:              Epigastric abdominal pain, Abdominal pain in the                            right upper quadrant Providers:                Norvel Richards, MD, Gwenlyn Fudge, RN, Aram Candela Referring MD:              Medicines:                Propofol per Anesthesia Complications:            No immediate complications. Estimated Blood Loss:     Estimated blood loss: none. Procedure:                Pre-Anesthesia Assessment:                           - Prior to the procedure, a History and Physical                            was performed, and patient medications and                            allergies were reviewed. The patient's tolerance of                            previous anesthesia was also reviewed. The risks                            and benefits of the procedure and the sedation                            options and risks were discussed with the patient.                            All questions were answered, and informed consent                            was obtained. Prior Anticoagulants: The patient has                            taken no previous anticoagulant or antiplatelet                            agents. ASA Grade Assessment: II - A patient with  mild systemic disease. After reviewing the risks                            and benefits, the patient was deemed in                            satisfactory condition to undergo the procedure.                           After obtaining informed consent, the endoscope was                            passed under direct vision. Throughout the                            procedure, the patient's blood  pressure, pulse, and                            oxygen saturations were monitored continuously. The                            [id number]-[id number] ([id number]) scope was introduced through the                            mouth, and advanced to the second part of duodenum.                            The upper GI endoscopy was accomplished without                            difficulty. The patient tolerated the procedure                            well. Scope In: 8:26:20 AM Scope Out: 8:30:53 AM Total Procedure Duration: 0 hours 4 minutes 33 seconds  Findings:      Patient had a couple of tiny distal esophageal erosion straddling the GE       junction. Otherwise, the esophagus appeared normal..      The entire examined stomach was normal.      The duodenal bulb and second portion of the duodenum were normal.       Biopsies were taken with a cold forceps for histology. Impression:               - Minimal esophageal mucosal changes consistent                            mild reflux related injury.                           - Normal stomach.                           - Normal duodenal bulb and second portion of the  duodenum. Moderate Sedation:      Moderate (conscious) sedation was personally administered by an       anesthesia professional. The following parameters were monitored: oxygen       saturation, heart rate, blood pressure, and response to care. Recommendation:           -                           - Resume regular diet. Trial of Protonix 40 mg                            daily. New prescription provided. Office visit with                            [place] in 4 to 6 weeks. Procedure Code(s):        --- Professional ---                           [phone], Esophagogastroduodenoscopy, flexible,                            transoral; with biopsy, single or multiple Diagnosis Code(s):        --- Professional ---                           R10.13, Epigastric pain                            R10.11, Right upper quadrant pain CPT copyright 2019 American Medical Association. All rights reserved. The codes documented in this report are preliminary and upon coder review may  be revised to meet current compliance requirements. Cristopher Estimable. Shariah Assad, MD Norvel Richards, MD 03/29/2021 8:44:17 AM This report has been signed electronically. Number of Addenda: 0

## 2021-03-29 NOTE — Transfer of Care (Signed)
Immediate Anesthesia Transfer of Care Note  Patient: Andrea Miles  Procedure(s) Performed: ESOPHAGOGASTRODUODENOSCOPY (EGD) WITH PROPOFOL (N/A )  Patient Location: Endoscopy Unit  Anesthesia Type:General  Level of Consciousness: awake  Airway & Oxygen Therapy: Patient Spontanous Breathing  Post-op Assessment: Report given to RN  Post vital signs: Reviewed  Last Vitals:  Vitals Value Taken Time  BP    Temp 36.7 C 03/29/21 0837  Pulse 66 03/29/21 0837  Resp 13 03/29/21 0837  SpO2 95 % 03/29/21 0837    Last Pain:  Vitals:   03/29/21 0837  TempSrc: Oral  PainSc: 8       Patients Stated Pain Goal: 7 (46/95/07 2257)  Complications: No complications documented.

## 2021-04-17 ENCOUNTER — Encounter (HOSPITAL_COMMUNITY): Payer: Self-pay | Admitting: Internal Medicine

## 2021-06-18 ENCOUNTER — Ambulatory Visit: Payer: Medicare Other | Admitting: Gastroenterology

## 2021-08-08 ENCOUNTER — Other Ambulatory Visit: Payer: Self-pay

## 2021-08-08 ENCOUNTER — Ambulatory Visit (INDEPENDENT_AMBULATORY_CARE_PROVIDER_SITE_OTHER): Payer: Medicare Other | Admitting: Family Medicine

## 2021-08-08 ENCOUNTER — Encounter: Payer: Self-pay | Admitting: Family Medicine

## 2021-08-08 VITALS — BP 123/80 | Temp 97.2°F | Wt 191.6 lb

## 2021-08-08 DIAGNOSIS — R350 Frequency of micturition: Secondary | ICD-10-CM | POA: Diagnosis not present

## 2021-08-08 DIAGNOSIS — Z0001 Encounter for general adult medical examination with abnormal findings: Secondary | ICD-10-CM | POA: Diagnosis not present

## 2021-08-08 DIAGNOSIS — Z0000 Encounter for general adult medical examination without abnormal findings: Secondary | ICD-10-CM

## 2021-08-08 LAB — POCT URINALYSIS DIPSTICK
Protein, UA: POSITIVE — AB
Spec Grav, UA: >=1.03 — AB (ref 1.010–1.025)
pH, UA: 6 (ref 5.0–8.0)

## 2021-08-08 MED ORDER — NITROFURANTOIN MONOHYD MACRO 100 MG PO CAPS
100.0000 mg | ORAL_CAPSULE | Freq: Two times a day (BID) | ORAL | 0 refills | Status: DC
Start: 2021-08-08 — End: 2023-09-17

## 2021-08-08 MED ORDER — FLUCONAZOLE 150 MG PO TABS: 150.0000 mg | ORAL_TABLET | Freq: Once | ORAL | 4 refills | Status: AC

## 2021-08-08 MED ORDER — KETOCONAZOLE 2 % EX CREA: 1.0000 "application " | TOPICAL_CREAM | Freq: Every day | CUTANEOUS | 4 refills | Status: DC | PRN

## 2021-08-08 MED ORDER — DIAZEPAM 10 MG PO TABS: 10.0000 mg | ORAL_TABLET | Freq: Every evening | ORAL | 5 refills | Status: DC | PRN

## 2021-08-08 MED ORDER — HYDROXYZINE HCL 50 MG PO TABS: 50.0000 mg | ORAL_TABLET | Freq: Three times a day (TID) | ORAL | 3 refills | Status: DC | PRN

## 2021-08-08 NOTE — Progress Notes (Signed)
   Subjective:    Patient ID: Andrea Miles, female    DOB: November 15, 1956, 65 y.o.   MRN: [id number]  HPI AWV- Annual Wellness Visit  The patient was seen for their annual wellness visit. The patient's past medical history, surgical history, and family history were reviewed. Pertinent vaccines were reviewed ( tetanus, pneumonia, shingles, flu) The patient's medication list was reviewed and updated.  The height and weight were entered.  BMI recorded in electronic record elsewhere  Cognitive screening was completed. Outcome of Mini - Cog: pass   Falls /depression screening electronically recorded within record elsewhere  Current tobacco usage:none (All patients who use tobacco were given written and verbal information on quitting)  Recent listing of emergency department/hospitalizations over the past year were reviewed.  current specialist the patient sees on a regular basis: none   Medicare annual wellness visit patient questionnaire was reviewed.  A written screening schedule for the patient for the next 5-10 years was given. Appropriate discussion of followup regarding next visit was discussed.   Pt having frequency and burning. Off and on about 2 weeks.   Pt has multiple issues that she would like addressed to being on fixed income and not able to afford multiple visits. Informed pt that provider may ask her to schedule another appt.     Review of Systems     Objective:   Physical Exam Lungs clear heart regular HEENT benign extremities no edema abdomen is soft no guarding or rebound multiple benign moles on the arms and legs were shown to [place] her back does not have any moles       Assessment & Plan:  Adult wellness-complete.wellness physical was conducted today. Importance of diet and exercise were discussed in detail.  In addition to this a discussion regarding safety was also covered. We also reviewed over immunizations and gave recommendations regarding current  immunization needed for age.  In addition to this additional areas were also touched on including: Preventative health exams needed:  Colonoscopy up-to-date currently  Patient was advised yearly wellness exam  Intermittent abdominal pain ever since having her surgery in the upper epigastrium region and right upper quadrant region more than likely scar tissue if ongoing troubles worrisome issues or severe such as vomiting weight loss bloody stools melena hematemesis then needs to be seen right away  Previous lab work looks reasonable.  Continue current measures  Chronic insomnia uses Valium once every 2-3 nights refills given caution drowsiness  Intermittent allergies to various environmental issues takes hydroxyzine this helps refills given  Mammogram recommended  Female health exam recommended every 3 years  Benign moles on the skin no worrisome features follow-up if problems  UTI Macrobid twice daily for 5 days Diflucan if needed follow-up if ongoing troubles  Right intermittent sharp pains no swelling comes and goes infrequent exam today is normal I told the patient if it continues or worsens recommend follow-up for a specific evaluation and exam possible x-rays possible referral to orthopedics for now patient would like to watch

## 2021-08-15 ENCOUNTER — Other Ambulatory Visit (HOSPITAL_COMMUNITY): Payer: Self-pay | Admitting: Family Medicine

## 2021-08-15 DIAGNOSIS — Z1231 Encounter for screening mammogram for malignant neoplasm of breast: Secondary | ICD-10-CM

## 2021-08-16 ENCOUNTER — Other Ambulatory Visit: Payer: Self-pay | Admitting: Family Medicine

## 2021-08-16 ENCOUNTER — Telehealth: Payer: Self-pay | Admitting: Family Medicine

## 2021-08-16 NOTE — Telephone Encounter (Signed)
PA for Diazepam has been denied. 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 Please advise. Thank you

## 2021-08-19 NOTE — Telephone Encounter (Signed)
So essentially the patient needs to understand that Medicare does not pay for benzodiazepines for sleep.  As a matter of fact they do not pay for other sleep medicines. The patient also needs to understand that it is advisable under current guidelines to try to minimize medication for sleep basically meaning that she should consider cutting down to 5 mg per night over the next 30 days then down to two mg each evening for 30 days then stopping. If she insists on continuing at 10 mg at night that would have to be paid for out of her pocket because Medicare will not reimburse for that Please touch base with patient regarding this issue

## 2021-08-20 NOTE — Telephone Encounter (Signed)
Mychart message sent to patient.

## 2021-08-21 NOTE — Telephone Encounter (Signed)
Pt replied via my chart and states she is paying for this med herself.

## 2021-08-27 ENCOUNTER — Ambulatory Visit (HOSPITAL_COMMUNITY)
Admission: RE | Admit: 2021-08-27 | Discharge: 2021-08-27 | Disposition: A | Discharge status: Home / Self Care | Payer: Medicare Other | Source: Ambulatory Visit | Attending: Family Medicine | Admitting: Family Medicine

## 2021-08-27 ENCOUNTER — Other Ambulatory Visit: Payer: Self-pay

## 2021-08-27 DIAGNOSIS — Z1231 Encounter for screening mammogram for malignant neoplasm of breast: Secondary | ICD-10-CM | POA: Diagnosis present

## 2021-11-17 ENCOUNTER — Ambulatory Visit
Admission: EM | Admit: 2021-11-17 | Discharge: 2021-11-17 | Disposition: A | Discharge status: Home / Self Care | Payer: Medicare Other | Attending: Urgent Care | Admitting: Urgent Care

## 2021-11-17 ENCOUNTER — Other Ambulatory Visit: Payer: Self-pay

## 2021-11-17 ENCOUNTER — Encounter: Payer: Self-pay | Admitting: Emergency Medicine

## 2021-11-17 DIAGNOSIS — R0981 Nasal congestion: Secondary | ICD-10-CM

## 2021-11-17 DIAGNOSIS — J069 Acute upper respiratory infection, unspecified: Secondary | ICD-10-CM | POA: Diagnosis not present

## 2021-11-17 DIAGNOSIS — R052 Subacute cough: Secondary | ICD-10-CM

## 2021-11-17 DIAGNOSIS — Z20828 Contact with and (suspected) exposure to other viral communicable diseases: Secondary | ICD-10-CM

## 2021-11-17 MED ORDER — CETIRIZINE HCL 10 MG PO TABS: 10.0000 mg | ORAL_TABLET | Freq: Every day | ORAL | 0 refills | Status: DC

## 2021-11-17 MED ORDER — PSEUDOEPHEDRINE HCL 60 MG PO TABS: 60.0000 mg | ORAL_TABLET | Freq: Three times a day (TID) | ORAL | 0 refills | Status: DC | PRN

## 2021-11-17 MED ORDER — BENZONATATE 100 MG PO CAPS: 100.0000 mg | ORAL_CAPSULE | Freq: Three times a day (TID) | ORAL | 0 refills | Status: DC | PRN

## 2021-11-17 MED ORDER — PROMETHAZINE-DM 6.25-15 MG/5ML PO SYRP: 5.0000 mL | ORAL_SOLUTION | Freq: Every evening | ORAL | 0 refills | Status: DC | PRN

## 2021-11-17 NOTE — ED Provider Notes (Signed)
[place]   MRN: [id number] DOB: Mar 15, 1956  Subjective:   Andrea Miles is a 65 y.o. female presenting for 5-day history of acute onset persistent sinus congestion and throat pain, throat congestion, intermittent coughing to clear her throat congestion.  Has had a lot of exposure to people around her that have been diagnosed with a viral upper respiratory infection.  She did COVID test at home multiple times and follow-up were negative.  She is not opposed to flu testing.  No chest pain, shortness of breath or wheezing.  No history of heart conditions, respiratory disorders.  Patient is non-smoker.  No current facility-administered medications for this encounter.  Current Outpatient Medications:    APPLE CIDER VINEGAR PO, Take 450 mg by mouth daily., Disp: , Rfl:    aspirin EC 81 MG tablet, Take 81 mg by mouth daily. Swallow whole. , Disp: , Rfl:    BIOTIN PO, Take 1 tablet by mouth daily., Disp: , Rfl:    diazepam (VALIUM) 10 MG tablet, Take 1 tablet (10 mg total) by mouth at bedtime as needed for sleep., Disp: 30 tablet, Rfl: 5   Ginkgo 60 MG TABS, Take 60 mg by mouth daily., Disp: , Rfl:    Glucosamine HCl 1500 MG TABS, Take 1,500 mg by mouth daily., Disp: , Rfl:    hydrOXYzine (ATARAX/VISTARIL) 50 MG tablet, Take 1 tablet (50 mg total) by mouth 3 (three) times daily as needed (Allergies)., Disp: 90 tablet, Rfl: 3   ketoconazole (NIZORAL) 2 % cream, Apply 1 application topically daily as needed for irritation., Disp: 30 g, Rfl: 4   Multiple Vitamin (MULTIVITAMIN WITH MINERALS) TABS tablet, Take 1 tablet by mouth daily., Disp: , Rfl:    multivitamin-lutein (OCUVITE-LUTEIN) CAPS capsule, Take 1 capsule by mouth daily., Disp: , Rfl:    nitrofurantoin, macrocrystal-monohydrate, (MACROBID) 100 MG capsule, Take 1 capsule (100 mg total) by mouth 2 (two) times daily., Disp: 10 capsule, Rfl: 0   Omega-3 Fatty Acids (OMEGA 3 PO), Take 250 mg by mouth daily., Disp: , Rfl:     vitamin B-12 (CYANOCOBALAMIN) 1000 MCG tablet, Take 1,000 mcg by mouth daily., Disp: , Rfl:    Allergies  Allergen Reactions   Amitriptyline     seizures   Codeine     Put pt in coma   Darvocet [Propoxyphene N-Acetaminophen] Nausea And Vomiting   Dolamide [Chlorpropamide]     Increases Blood Pressure   Oxycodone Nausea And Vomiting   Percocet [Oxycodone-Acetaminophen]     Increases Blood Pressure   Sulfa Antibiotics Swelling   Toradol [Ketorolac Tromethamine] Swelling   Vicodin [Hydrocodone-Acetaminophen] Nausea And Vomiting    Projectile vomiting    Past Medical History:  Diagnosis Date   Gallstones    Kidney stones    PONV (postoperative nausea and vomiting)      Past Surgical History:  Procedure Laterality Date   ABDOMINAL HYSTERECTOMY     no cancer complete hysterectomy 2010   APPENDECTOMY     CHOLECYSTECTOMY N/A 08/28/2020   Procedure: LAPAROSCOPIC CHOLECYSTECTOMY;  Surgeon: Aviva Signs, MD;  Location: [facility];  Service: General;  Laterality: N/A;   COLONOSCOPY  2008   Dr. Gala Romney: normal   COLONOSCOPY WITH PROPOFOL N/A 09/14/2020   Dr. Gala Romney: Single tubular adenoma removed, diverticulosis.  Next colonoscopy in 7 years.   ESOPHAGOGASTRODUODENOSCOPY (EGD) WITH PROPOFOL N/A 03/29/2021   Procedure: ESOPHAGOGASTRODUODENOSCOPY (EGD) WITH PROPOFOL;  Surgeon: Daneil Dolin, MD;  Location: [facility];  Service: Endoscopy;  Laterality:  N/A;  am appt, pt tested + 3/2 < 90 days   POLYPECTOMY  09/14/2020   Procedure: POLYPECTOMY;  Surgeon: Daneil Dolin, MD;  Location: [facility];  Service: Endoscopy;;    Family History  Problem Relation Age of Onset   Cancer Other        breast   Lung cancer Father    Colon cancer Neg Hx    Colon polyps Neg Hx     Social History   Tobacco Use   Smoking status: Never   Smokeless tobacco: Never  Vaping Use   Vaping Use: Never used  Substance Use Topics   Alcohol use: No   Drug use: No    ROS   Objective:    Vitals: BP 135/84 (BP Location: Right Arm)   Pulse 84   Temp 98.6 F (37 C) (Oral)   Resp 18   SpO2 96%   Physical Exam Constitutional:      General: She is not in acute distress.    Appearance: Normal appearance. She is well-developed and normal weight. She is not ill-appearing, toxic-appearing or diaphoretic.  HENT:     Head: Normocephalic and atraumatic.     Nose: Congestion and rhinorrhea present.     Mouth/Throat:     Mouth: Mucous membranes are moist.     Pharynx: No pharyngeal swelling, oropharyngeal exudate, posterior oropharyngeal erythema or uvula swelling.     Tonsils: No tonsillar exudate or tonsillar abscesses. 0 on the right. 0 on the left.  Eyes:     General: No scleral icterus.       Right eye: No discharge.        Left eye: No discharge.     Extraocular Movements: Extraocular movements intact.     Conjunctiva/sclera: Conjunctivae normal.     Pupils: Pupils are equal, round, and reactive to light.  Cardiovascular:     Rate and Rhythm: Normal rate and regular rhythm.     Pulses: Normal pulses.     Heart sounds: Normal heart sounds. No murmur heard.   No friction rub. No gallop.  Pulmonary:     Effort: Pulmonary effort is normal. No respiratory distress.     Breath sounds: Normal breath sounds. No stridor. No wheezing, rhonchi or rales.  Skin:    General: Skin is warm and dry.     Findings: No rash.  Neurological:     Mental Status: She is alert and oriented to person, place, and time.  Psychiatric:        Mood and Affect: Mood normal.        Behavior: Behavior normal.        Thought Content: Thought content normal.    Assessment and Plan :   PDMP not reviewed this encounter.  1. Viral upper respiratory illness   2. Exposure to the flu   3. Sinus congestion   4. Subacute cough    Deferred imaging given clear cardiopulmonary exam, hemodynamically stable vital signs. COVID and flu test pending.  We will otherwise manage for viral upper respiratory  infection.  Physical exam findings reassuring and vital signs stable for discharge. Advised supportive care, offered symptomatic relief. Counseled patient on potential for adverse effects with medications prescribed/recommended today, ER and return-to-clinic precautions discussed, patient verbalized understanding.      Jaynee Eagles, PA-C 11/17/21 1313

## 2021-11-17 NOTE — ED Triage Notes (Signed)
Nasal congestion, sore throat, upper chest congestion since Tuesday.

## 2021-11-17 NOTE — Discharge Instructions (Signed)
We will notify you of your test results as they arrive and may take between 48-72 hours.  I encourage you to sign up for MyChart if you have not already done so as this can be the easiest way for [place] to communicate results to you online or through a phone app.  Generally, we only contact you if it is a positive test result.  In the meantime, if you develop worsening symptoms including fever, chest pain, shortness of breath despite our current treatment plan then please report to the emergency room as this may be a sign of worsening status from possible viral infection.  Otherwise, we will manage this as a viral syndrome. For sore throat or cough try using a honey-based tea. Use 3 teaspoons of honey with juice squeezed from half lemon. Place shaved pieces of ginger into 1/2-1 cup of water and warm over stove top. Then mix the ingredients and repeat every 4 hours as needed. Please take Tylenol 500mg -650mg  every 6 hours for aches and pains, fevers. Hydrate very well with at least 2 liters of water. Eat light meals such as soups to replenish electrolytes and soft fruits, veggies. Start an antihistamine like Zyrtec for postnasal drainage, sinus congestion.  You can take this together with pseudoephedrine (Sudafed) at a dose of 60 mg 2-3 times a day as needed for the same kind of congestion.  Use the cough medications as needed.

## 2021-11-18 LAB — COVID-19, FLU A+B NAA
Influenza A, NAA: NOT DETECTED
Influenza B, NAA: NOT DETECTED
SARS-CoV-2, NAA: DETECTED — AB

## 2021-12-03 IMAGING — MG DIGITAL SCREENING BILAT W/ TOMO W/ CAD
6 of 10 series · 6 of 30 positions shown · non-contrast
Comparison: Previous exam(s).

CLINICAL DATA: Screening.

EXAM:
DIGITAL SCREENING BILATERAL MAMMOGRAM WITH TOMO AND CAD

[L MLO synth-2D (1 of 2)]
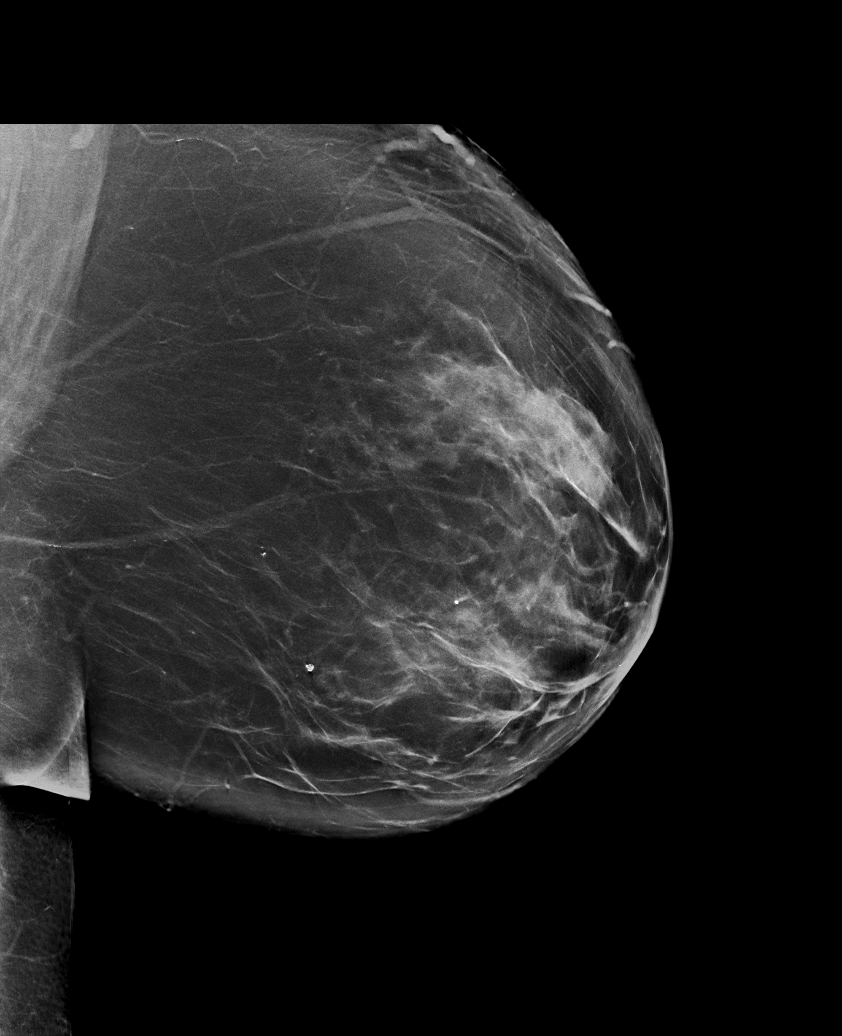

[R CC synth-2D]
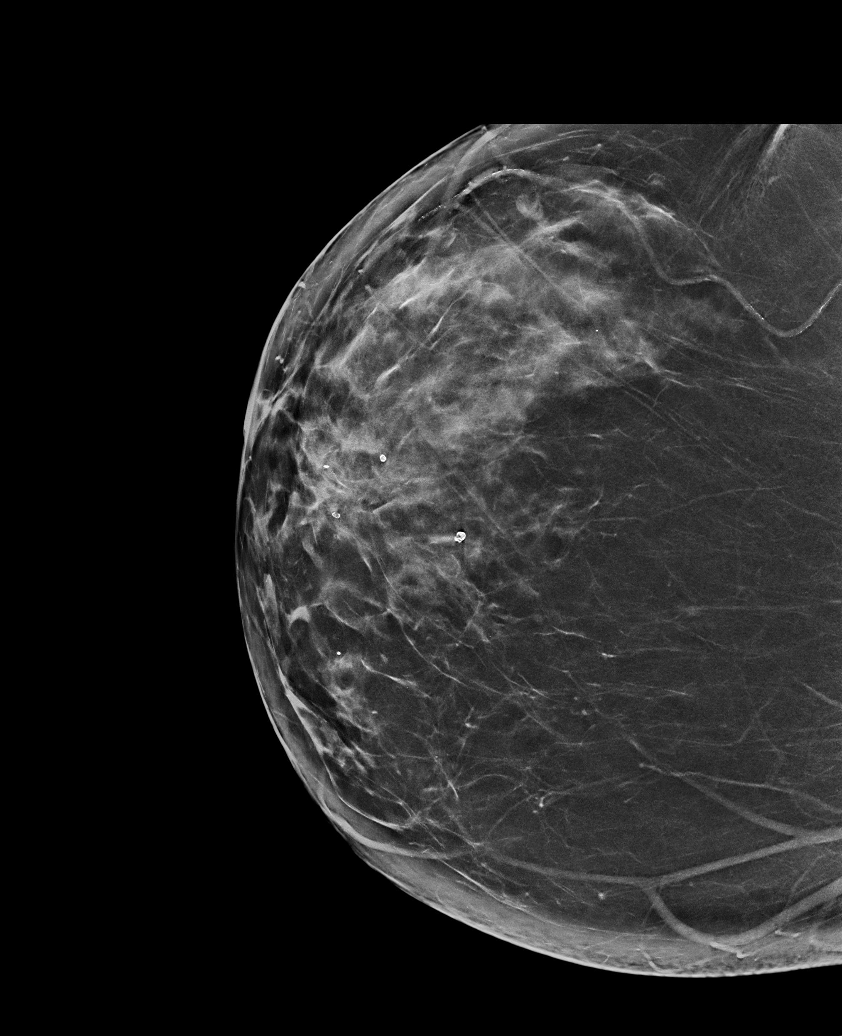

[R MLO synth-2D]
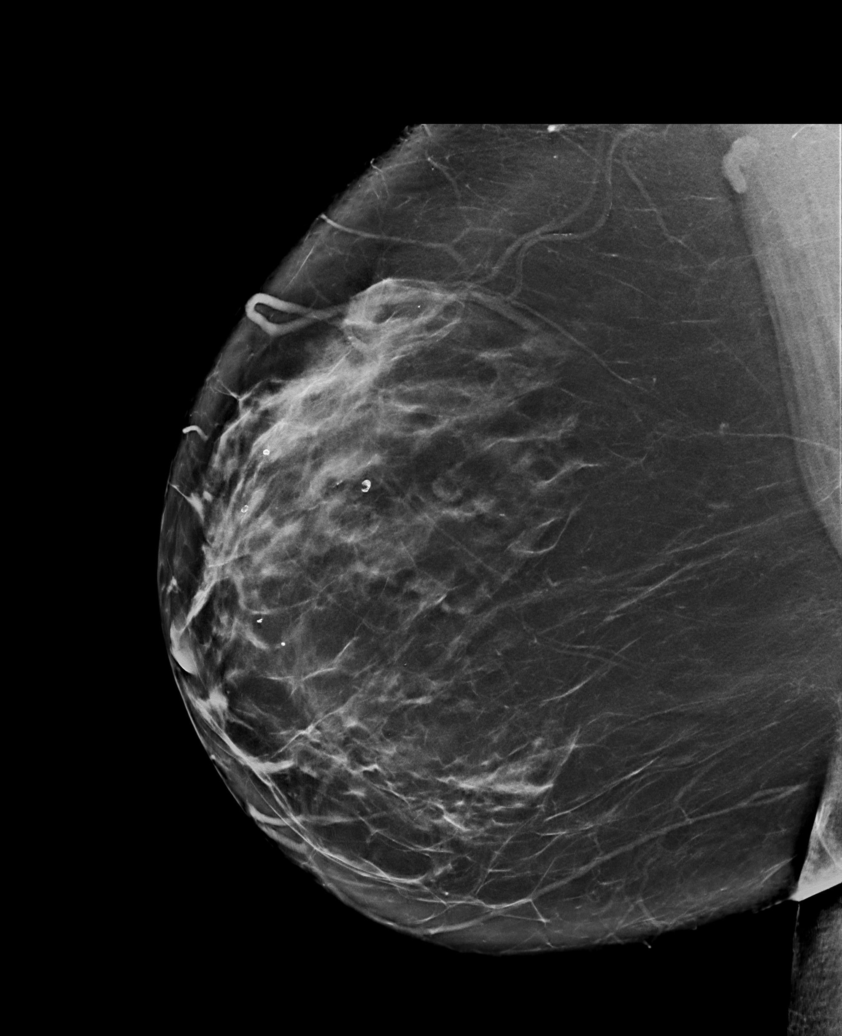

[L MLO synth-2D (2 of 2)]
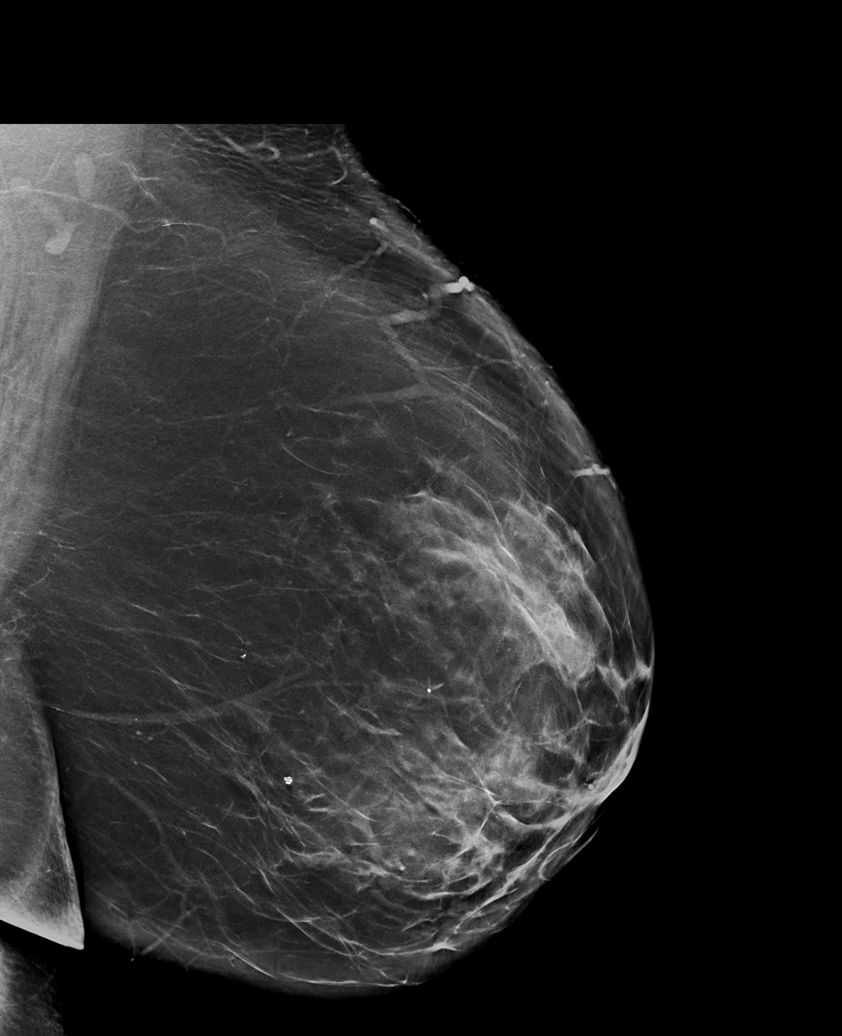

[L CC synth-2D]
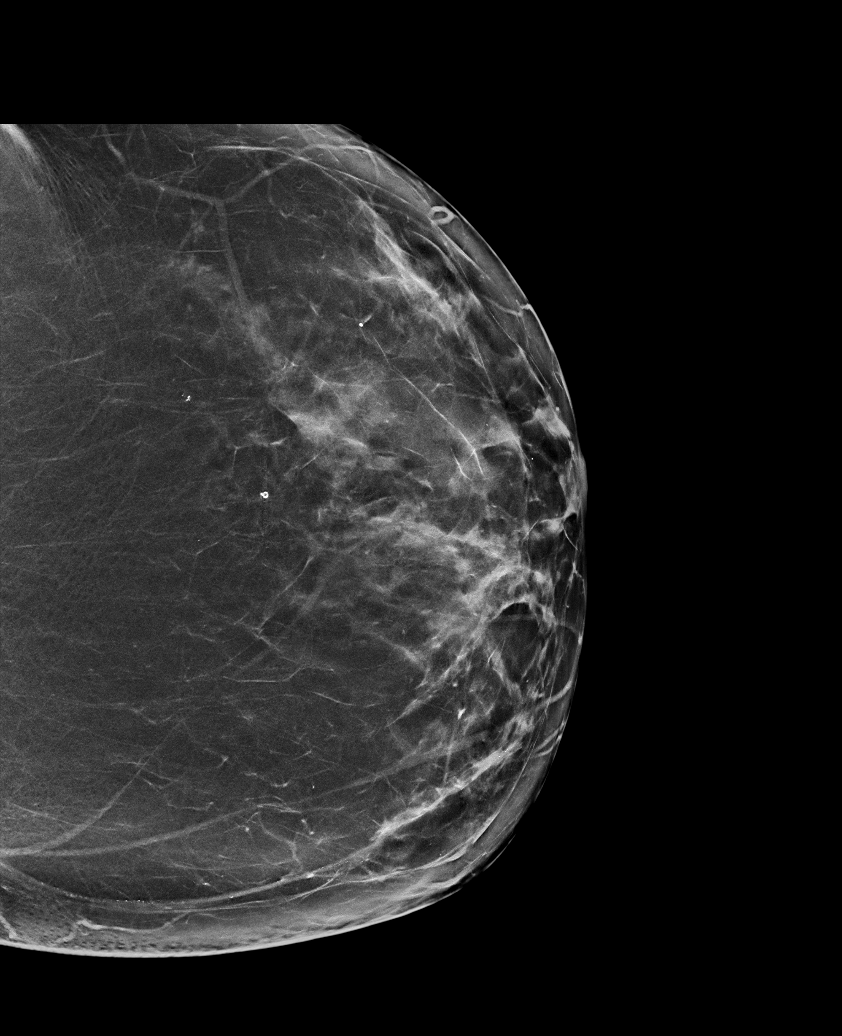

[L MLO tomo · tomo slice 51/101.0]
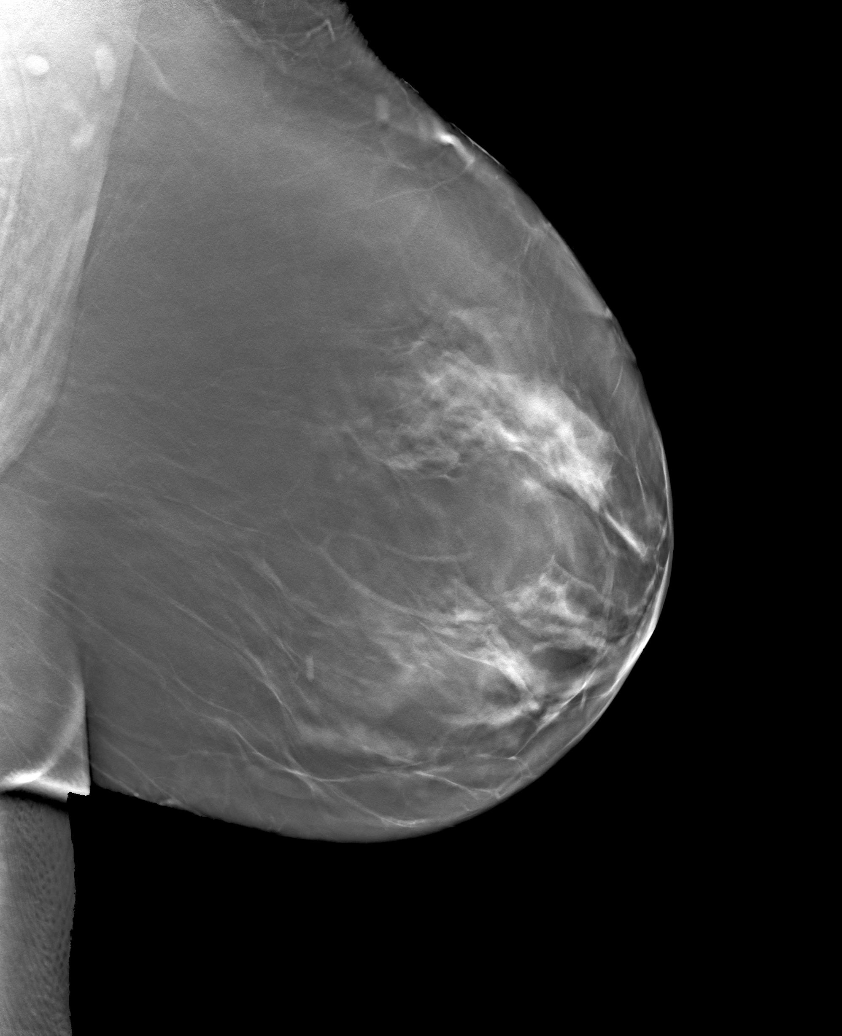

[6 of 30 positions shown; findings below may reference images not displayed]

ACR Breast Density Category c: The breast tissue is heterogeneously
dense, which may obscure small masses.
FINDINGS: There are no findings suspicious for malignancy. Images were
processed with CAD.
IMPRESSION: No mammographic evidence of malignancy. A result letter of this
screening mammogram will be mailed directly to the patient.

RECOMMENDATION:
Screening mammogram in one year. (Code:[id number]-[id number])

BI-RADS CATEGORY  1: Negative.

## 2021-12-15 ENCOUNTER — Other Ambulatory Visit: Payer: Self-pay | Admitting: Internal Medicine

## 2022-03-05 ENCOUNTER — Other Ambulatory Visit: Payer: Self-pay | Admitting: Family Medicine

## 2022-07-07 ENCOUNTER — Other Ambulatory Visit: Payer: Self-pay | Admitting: Family Medicine

## 2022-07-12 ENCOUNTER — Other Ambulatory Visit: Payer: Self-pay | Admitting: Family Medicine

## 2022-07-12 NOTE — Telephone Encounter (Signed)
Sent my chart message to schedule appointment 07/12/22

## 2022-08-13 ENCOUNTER — Ambulatory Visit (INDEPENDENT_AMBULATORY_CARE_PROVIDER_SITE_OTHER): Payer: Medicare Other | Admitting: Family Medicine

## 2022-08-13 VITALS — BP 114/82 | HR 94 | Temp 97.5°F | Ht 68.0 in | Wt 189.0 lb

## 2022-08-13 DIAGNOSIS — Z0000 Encounter for general adult medical examination without abnormal findings: Secondary | ICD-10-CM

## 2022-08-13 DIAGNOSIS — E785 Hyperlipidemia, unspecified: Secondary | ICD-10-CM | POA: Diagnosis not present

## 2022-08-13 DIAGNOSIS — Z780 Asymptomatic menopausal state: Secondary | ICD-10-CM | POA: Diagnosis not present

## 2022-08-13 DIAGNOSIS — N289 Disorder of kidney and ureter, unspecified: Secondary | ICD-10-CM | POA: Diagnosis not present

## 2022-08-13 MED ORDER — HYDROXYZINE HCL 50 MG PO TABS: 50.0000 mg | ORAL_TABLET | Freq: Three times a day (TID) | ORAL | 3 refills | Status: DC | PRN

## 2022-08-13 MED ORDER — DIAZEPAM 10 MG PO TABS
10.0000 mg | ORAL_TABLET | Freq: Every evening | ORAL | 5 refills | Status: DC | PRN
Start: 2022-08-13 — End: 2023-09-19

## 2022-08-13 MED ORDER — KETOCONAZOLE 2 % EX CREA: TOPICAL_CREAM | Freq: Every day | CUTANEOUS | 5 refills | Status: DC | PRN

## 2022-08-13 NOTE — Progress Notes (Signed)
   Subjective:    Patient ID: Andrea Miles, female    DOB: 1956-11-08, 66 y.o.   MRN: [id number]  HPI  AWV- Annual Wellness Visit  The patient was seen for their annual wellness visit. The patient's past medical history, surgical history, and family history were reviewed. Pertinent vaccines were reviewed ( tetanus, pneumonia, shingles, flu) The patient's medication list was reviewed and updated.  The height and weight were entered.  BMI recorded in electronic record elsewhere  Cognitive screening was completed. Outcome of Mini - Cog: 5   Falls /depression screening electronically recorded within record elsewhere  Current tobacco usage:no (All patients who use tobacco were given written and verbal information on quitting)  Recent listing of emergency department/hospitalizations over the past year were reviewed.  current specialist the patient sees on a regular basis: no   Medicare annual wellness visit patient questionnaire was reviewed.  A written screening schedule for the patient for the next 5-10 years was given. Appropriate discussion of followup regarding next visit was discussed.     Review of Systems     Objective:   Physical Exam  General-in no acute distress Eyes-no discharge Lungs-respiratory rate normal, CTA CV-no murmurs,RRR Extremities skin warm dry no edema Neuro grossly normal Behavior normal, alert       Assessment & Plan:   Patient has been doing well with healthy eating regular physical activity to keep her weight down  She does have intermittent insomnia and is stressed because of health issues with her son as well as husband Adult wellness-complete.wellness physical was conducted today. Importance of diet and exercise were discussed in detail.  Importance of stress reduction and healthy living were discussed.  In addition to this a discussion regarding safety was also covered.  We also reviewed over immunizations and gave  recommendations regarding current immunization needed for age.   In addition to this additional areas were also touched on including: Preventative health exams needed:  Colonoscopy 2028  Patient was advised yearly wellness exam  Insert you may use Valium as needed not for frequent use evening use only must develop 8 hours of sleep to the use of this medicine refills given patient cautioned against using it on a regular basis

## 2022-08-14 LAB — BASIC METABOLIC PANEL
BUN/Creatinine Ratio: 14 (ref 12–28)
BUN: 16 mg/dL (ref 8–27)
CO2: 24 mmol/L (ref 20–29)
Calcium: 10.2 mg/dL (ref 8.7–10.3)
Chloride: 103 mmol/L (ref 96–106)
Creatinine, Ser: 1.13 mg/dL — ABNORMAL HIGH (ref 0.57–1.00)
Glucose: 98 mg/dL (ref 70–99)
Potassium: 3.8 mmol/L (ref 3.5–5.2)
Sodium: 144 mmol/L (ref 134–144)
eGFR: 53 mL/min/{1.73_m2} — ABNORMAL LOW (ref >59)

## 2022-08-14 LAB — HEPATIC FUNCTION PANEL
ALT: 12 IU/L (ref 0–32)
AST: 19 IU/L (ref 0–40)
Albumin: 5 g/dL — ABNORMAL HIGH (ref 3.9–4.9)
Alkaline Phosphatase: 102 IU/L (ref 44–121)
Bilirubin Total: 0.7 mg/dL (ref 0.0–1.2)
Bilirubin, Direct: 0.18 mg/dL (ref 0.00–0.40)
Total Protein: 8.3 g/dL (ref 6.0–8.5)

## 2022-08-14 LAB — LIPID PANEL
Chol/HDL Ratio: 2.7 ratio (ref 0.0–4.4)
Cholesterol, Total: 141 mg/dL (ref 100–199)
HDL: 53 mg/dL (ref >39)
LDL Chol Calc (NIH): 71 mg/dL (ref 0–99)
Triglycerides: 92 mg/dL (ref 0–149)
VLDL Cholesterol Cal: 17 mg/dL (ref 5–40)

## 2022-08-15 NOTE — Telephone Encounter (Signed)
Patient had followup on 08/13/22

## 2022-08-16 DIAGNOSIS — N289 Disorder of kidney and ureter, unspecified: Secondary | ICD-10-CM | POA: Diagnosis not present

## 2022-08-16 NOTE — Addendum Note (Signed)
Addended by: Dairl Ponder on: 08/16/2022 09:20 AM   Modules accepted: Orders

## 2022-08-18 LAB — BASIC METABOLIC PANEL
BUN/Creatinine Ratio: 9 — ABNORMAL LOW (ref 12–28)
BUN: 9 mg/dL (ref 8–27)
CO2: 24 mmol/L (ref 20–29)
Calcium: 9.7 mg/dL (ref 8.7–10.3)
Chloride: 102 mmol/L (ref 96–106)
Creatinine, Ser: 0.97 mg/dL (ref 0.57–1.00)
Glucose: 95 mg/dL (ref 70–99)
Potassium: 3.9 mmol/L (ref 3.5–5.2)
Sodium: 144 mmol/L (ref 134–144)
eGFR: 64 mL/min/{1.73_m2} (ref >59)

## 2022-08-18 LAB — MICROALBUMIN / CREATININE URINE RATIO
Creatinine, Urine: 30 mg/dL
Microalb/Creat Ratio: <10 mg/g creat (ref 0–29)
Microalbumin, Urine: <3 ug/mL

## 2022-09-02 ENCOUNTER — Ambulatory Visit (HOSPITAL_COMMUNITY)
Admission: RE | Admit: 2022-09-02 | Discharge: 2022-09-02 | Disposition: A | Discharge status: Home / Self Care | Payer: Medicare Other | Source: Ambulatory Visit | Attending: Family Medicine | Admitting: Family Medicine

## 2022-09-02 DIAGNOSIS — Z780 Asymptomatic menopausal state: Secondary | ICD-10-CM | POA: Diagnosis not present

## 2022-09-02 DIAGNOSIS — Z0000 Encounter for general adult medical examination without abnormal findings: Secondary | ICD-10-CM | POA: Insufficient documentation

## 2022-09-06 ENCOUNTER — Other Ambulatory Visit (HOSPITAL_COMMUNITY): Payer: Self-pay | Admitting: Family Medicine

## 2022-09-06 DIAGNOSIS — Z1231 Encounter for screening mammogram for malignant neoplasm of breast: Secondary | ICD-10-CM

## 2022-09-16 ENCOUNTER — Ambulatory Visit (HOSPITAL_COMMUNITY)
Admission: RE | Admit: 2022-09-16 | Discharge: 2022-09-16 | Disposition: A | Discharge status: Home / Self Care | Payer: Medicare Other | Source: Ambulatory Visit | Attending: Family Medicine | Admitting: Family Medicine

## 2022-09-16 DIAGNOSIS — Z1231 Encounter for screening mammogram for malignant neoplasm of breast: Secondary | ICD-10-CM | POA: Diagnosis not present

## 2022-12-17 ENCOUNTER — Other Ambulatory Visit: Payer: Self-pay | Admitting: Gastroenterology

## 2022-12-17 NOTE — Telephone Encounter (Signed)
Ov needed. Limited refill provided.

## 2023-01-29 ENCOUNTER — Ambulatory Visit: Payer: Medicare Other | Admitting: Gastroenterology

## 2023-03-20 ENCOUNTER — Other Ambulatory Visit: Payer: Self-pay | Admitting: Gastroenterology

## 2023-08-18 ENCOUNTER — Other Ambulatory Visit (HOSPITAL_COMMUNITY): Payer: Self-pay | Admitting: Family Medicine

## 2023-08-18 DIAGNOSIS — Z1231 Encounter for screening mammogram for malignant neoplasm of breast: Secondary | ICD-10-CM

## 2023-09-05 ENCOUNTER — Encounter: Payer: Medicare Other | Admitting: Family Medicine

## 2023-09-17 ENCOUNTER — Telehealth: Payer: Self-pay | Admitting: Family Medicine

## 2023-09-17 ENCOUNTER — Ambulatory Visit: Payer: Medicare Other | Admitting: Nurse Practitioner

## 2023-09-17 VITALS — BP 138/88 | HR 103 | Temp 98.1°F | Ht 68.0 in | Wt 190.0 lb

## 2023-09-17 DIAGNOSIS — Z13220 Encounter for screening for lipoid disorders: Secondary | ICD-10-CM

## 2023-09-17 DIAGNOSIS — E7800 Pure hypercholesterolemia, unspecified: Secondary | ICD-10-CM

## 2023-09-17 DIAGNOSIS — Z13228 Encounter for screening for other metabolic disorders: Secondary | ICD-10-CM

## 2023-09-17 DIAGNOSIS — Z130 Encounter for screening for diseases of the blood and blood-forming organs and certain disorders involving the immune mechanism: Secondary | ICD-10-CM | POA: Diagnosis not present

## 2023-09-17 DIAGNOSIS — Z23 Encounter for immunization: Secondary | ICD-10-CM

## 2023-09-17 DIAGNOSIS — Z0001 Encounter for general adult medical examination with abnormal findings: Secondary | ICD-10-CM

## 2023-09-17 DIAGNOSIS — R5383 Other fatigue: Secondary | ICD-10-CM | POA: Diagnosis not present

## 2023-09-17 DIAGNOSIS — Z1329 Encounter for screening for other suspected endocrine disorder: Secondary | ICD-10-CM | POA: Diagnosis not present

## 2023-09-17 DIAGNOSIS — Z01419 Encounter for gynecological examination (general) (routine) without abnormal findings: Secondary | ICD-10-CM

## 2023-09-17 DIAGNOSIS — R944 Abnormal results of kidney function studies: Secondary | ICD-10-CM

## 2023-09-17 DIAGNOSIS — Z0000 Encounter for general adult medical examination without abnormal findings: Secondary | ICD-10-CM

## 2023-09-17 DIAGNOSIS — M65312 Trigger thumb, left thumb: Secondary | ICD-10-CM

## 2023-09-17 MED ORDER — HYDROXYZINE HCL 50 MG PO TABS: 50.0000 mg | ORAL_TABLET | Freq: Three times a day (TID) | ORAL | 3 refills | Status: DC | PRN

## 2023-09-17 NOTE — Progress Notes (Addendum)
Subjective:    Patient ID: Andrea Miles, female    DOB: [birth year], 67 y.o.   MRN: [id number]  HPI AWV- Annual Wellness Visit  The patient was seen for their annual wellness visit. The patient's past medical history, surgical history, and family history were reviewed. Pertinent vaccines were reviewed ( tetanus, pneumonia, shingles, flu) The patient's medication list was reviewed and updated.  The height and weight were entered.  BMI recorded in electronic record elsewhere  Cognitive screening was completed. Outcome of Mini - Cog: see cognitive screening 09/19/23   Falls /depression screening electronically recorded within record elsewhere  Current tobacco usage: none (All patients who use tobacco were given written and verbal information on quitting)  Recent listing of emergency department/hospitalizations over the past year were reviewed.  current specialist the patient sees on a regular basis: none   Medicare annual wellness visit patient questionnaire was reviewed.  A written screening schedule for the patient for the next 5-10 years was given. Appropriate discussion of followup regarding next visit was discussed. Has an ACP completed and notarized Diet: healthy overall Activity: active lifestyle Had RSV vaccine last year.  Mammogram scheduled for tomorrow Married, same sexual partner. Has had a hysterectomy.  Bone density 08/13/22 Regular vision and dental exams.  Only concern today is her left thumb and right middle finger will lock up at times; painful to straighten out.      Review of Systems  Constitutional:  Negative for activity change, appetite change and fatigue.  HENT:  Negative for sore throat and trouble swallowing.   Respiratory:  Negative for cough, chest tightness, shortness of breath and wheezing.   Cardiovascular:  Negative for chest pain.  Gastrointestinal:  Negative for abdominal distention, abdominal pain, constipation, diarrhea, nausea and  vomiting.  Genitourinary:  Negative for difficulty urinating, dysuria, enuresis, frequency, genital sores, pelvic pain, urgency and vaginal discharge.       Objective:   Physical Exam Vitals and nursing note reviewed. Chaperone present: Defers chaperone.  Constitutional:      General: She is not in acute distress.    Appearance: She is well-developed.  Neck:     Thyroid: No thyromegaly.     Trachea: No tracheal deviation.     Comments: Thyroid non tender to palpation. No mass or goiter noted.  Cardiovascular:     Rate and Rhythm: Normal rate and regular rhythm.     Heart sounds: Normal heart sounds. No murmur heard. Pulmonary:     Effort: Pulmonary effort is normal.     Breath sounds: Normal breath sounds.  Chest:  Breasts:    Right: No swelling, inverted nipple, mass, skin change or tenderness.     Left: No swelling, inverted nipple, mass, skin change or tenderness.  Abdominal:     General: There is no distension.     Palpations: Abdomen is soft.     Tenderness: There is no abdominal tenderness.  Genitourinary:    Comments: Defers GU exam.  Denies rashes lesions or erythema. Musculoskeletal:     Cervical back: Normal range of motion and neck supple.     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 No trigger finger noted but tenderness with movement of left thumb or right middle finger. No erythema or warmth.   Lymphadenopathy:     Cervical: No cervical adenopathy.     Upper Body:     Right upper body: No supraclavicular, axillary or pectoral adenopathy.     Left upper  body: No supraclavicular, axillary or pectoral adenopathy.  Skin:    General: Skin is warm and dry.     Findings: No rash.  Neurological:     Mental Status: She is alert and oriented to person, place, and time.  Psychiatric:        Mood and Affect: Mood normal.        Behavior: Behavior normal.        Thought Content: Thought content normal.        Judgment: Judgment  normal.    Today's Vitals   09/17/23 1308  BP: 138/88  Pulse: (!) 103  Temp: 98.1 F (36.7 C)  SpO2: 98%  Weight: 190 lb (86.2 kg)  Height: 5\' 8"  (1.727 m)   Body mass index is 28.89 kg/m.         Assessment & Plan:   Problem List Items Addressed This Visit       Musculoskeletal and Integument   Trigger finger of left thumb   Relevant Orders   Ambulatory referral to Hand Surgery   Other Visit Diagnoses     Encounter for subsequent annual wellness visit (AWV) in Medicare patient    -  Primary   Screening for deficiency anemia       Relevant Orders   CBC with Differential/Platelet (Completed)   Screening for metabolic disorder       Relevant Orders   Comprehensive metabolic panel (Completed)   Screening for lipid disorders       Relevant Orders   Lipid panel (Completed)   Screening for thyroid disorder       Relevant Orders   TSH (Completed)   Immunization due       Relevant Orders   Pneumococcal conjugate vaccine 20-valent (Prevnar 20) (Completed)   Well woman exam           Meds ordered this encounter  Medications   hydrOXYzine (ATARAX) 50 MG tablet    Sig: Take 1 tablet (50 mg total) by mouth 3 (three) times daily as needed (Allergies).    Dispense:  90 tablet    Refill:  3    Order Specific Question:   Supervising Provider    Answer:   Babs Sciara [phone]   Lab work pending. Continue healthy lifestyle habits. According to our records patient has received 1 dose of Shingrix, check to see if she has had the second. Tdap up-to-date. Patient is due for her pneumonia vaccine, Prevnar 20 given today. She will get her flu vaccine and COVID booster at local pharmacy. Referred to hand specialist for trigger finger on each hand. Return in about 1 year (around 09/16/2024) for physical.

## 2023-09-17 NOTE — Telephone Encounter (Signed)
Refill on  hydrOXYzine (ATARAX) 50 MG tablet  90 day supply send to [facility]

## 2023-09-18 ENCOUNTER — Encounter (HOSPITAL_COMMUNITY): Payer: Self-pay

## 2023-09-18 ENCOUNTER — Ambulatory Visit (HOSPITAL_COMMUNITY)
Admission: RE | Admit: 2023-09-18 | Discharge: 2023-09-18 | Disposition: A | Discharge status: Home / Self Care | Payer: Medicare Other | Source: Ambulatory Visit | Attending: Family Medicine | Admitting: Family Medicine

## 2023-09-18 ENCOUNTER — Encounter: Payer: Self-pay | Admitting: Nurse Practitioner

## 2023-09-18 DIAGNOSIS — Z1231 Encounter for screening mammogram for malignant neoplasm of breast: Secondary | ICD-10-CM | POA: Insufficient documentation

## 2023-09-18 LAB — CBC WITH DIFFERENTIAL/PLATELET
Basophils Absolute: 0.1 10*3/uL (ref 0.0–0.2)
Basos: 1 %
EOS (ABSOLUTE): 0.2 10*3/uL (ref 0.0–0.4)
Eos: 2 %
Hematocrit: 50 % — ABNORMAL HIGH (ref 34.0–46.6)
Hemoglobin: 16.8 g/dL — ABNORMAL HIGH (ref 11.1–15.9)
Immature Grans (Abs): 0 10*3/uL (ref 0.0–0.1)
Immature Granulocytes: 0 %
Lymphocytes Absolute: 3.3 10*3/uL — ABNORMAL HIGH (ref 0.7–3.1)
Lymphs: 33 %
MCH: 30.2 pg (ref 26.6–33.0)
MCHC: 33.6 g/dL (ref 31.5–35.7)
MCV: 90 fL (ref 79–97)
Monocytes Absolute: 0.7 10*3/uL (ref 0.1–0.9)
Monocytes: 7 %
Neutrophils Absolute: 5.7 10*3/uL (ref 1.4–7.0)
Neutrophils: 57 %
Platelets: 411 10*3/uL (ref 150–450)
RBC: 5.56 x10E6/uL — ABNORMAL HIGH (ref 3.77–5.28)
RDW: 12.7 % (ref 11.7–15.4)
WBC: 9.9 10*3/uL (ref 3.4–10.8)

## 2023-09-18 LAB — COMPREHENSIVE METABOLIC PANEL
ALT: 20 [IU]/L (ref 0–32)
AST: 28 [IU]/L (ref 0–40)
Albumin: 4.8 g/dL (ref 3.9–4.9)
Alkaline Phosphatase: 96 [IU]/L (ref 44–121)
BUN/Creatinine Ratio: 14 (ref 12–28)
BUN: 16 mg/dL (ref 8–27)
Bilirubin Total: 0.9 mg/dL (ref 0.0–1.2)
CO2: 21 mmol/L (ref 20–29)
Calcium: 10.7 mg/dL — ABNORMAL HIGH (ref 8.7–10.3)
Chloride: 101 mmol/L (ref 96–106)
Creatinine, Ser: 1.15 mg/dL — ABNORMAL HIGH (ref 0.57–1.00)
Globulin, Total: 3.4 g/dL (ref 1.5–4.5)
Glucose: 110 mg/dL — ABNORMAL HIGH (ref 70–99)
Potassium: 3.8 mmol/L (ref 3.5–5.2)
Sodium: 142 mmol/L (ref 134–144)
Total Protein: 8.2 g/dL (ref 6.0–8.5)
eGFR: 52 mL/min/{1.73_m2} — ABNORMAL LOW (ref >59)

## 2023-09-18 LAB — LIPID PANEL
Chol/HDL Ratio: 4.6 {ratio} — ABNORMAL HIGH (ref 0.0–4.4)
Cholesterol, Total: 212 mg/dL — ABNORMAL HIGH (ref 100–199)
HDL: 46 mg/dL (ref >39)
LDL Chol Calc (NIH): 138 mg/dL — ABNORMAL HIGH (ref 0–99)
Triglycerides: 154 mg/dL — ABNORMAL HIGH (ref 0–149)
VLDL Cholesterol Cal: 28 mg/dL (ref 5–40)

## 2023-09-18 LAB — TSH: TSH: 1.47 u[IU]/mL (ref 0.450–4.500)

## 2023-09-19 ENCOUNTER — Other Ambulatory Visit: Payer: Self-pay | Admitting: Nurse Practitioner

## 2023-09-19 DIAGNOSIS — M65312 Trigger thumb, left thumb: Secondary | ICD-10-CM | POA: Insufficient documentation

## 2023-09-19 MED ORDER — KETOCONAZOLE 2 % EX CREA: TOPICAL_CREAM | Freq: Every day | CUTANEOUS | 5 refills | Status: DC | PRN

## 2023-09-19 MED ORDER — DIAZEPAM 10 MG PO TABS: 10.0000 mg | ORAL_TABLET | Freq: Every evening | ORAL | 5 refills | Status: DC | PRN

## 2023-09-22 ENCOUNTER — Other Ambulatory Visit (HOSPITAL_COMMUNITY): Payer: Self-pay | Admitting: Family Medicine

## 2023-09-22 DIAGNOSIS — R928 Other abnormal and inconclusive findings on diagnostic imaging of breast: Secondary | ICD-10-CM

## 2023-09-30 ENCOUNTER — Ambulatory Visit (HOSPITAL_COMMUNITY)
Admission: RE | Admit: 2023-09-30 | Discharge: 2023-09-30 | Disposition: A | Discharge status: Home / Self Care | Payer: Medicare Other | Source: Ambulatory Visit | Attending: Family Medicine | Admitting: Family Medicine

## 2023-09-30 DIAGNOSIS — N6001 Solitary cyst of right breast: Secondary | ICD-10-CM | POA: Diagnosis not present

## 2023-09-30 DIAGNOSIS — R92333 Mammographic heterogeneous density, bilateral breasts: Secondary | ICD-10-CM | POA: Diagnosis not present

## 2023-09-30 DIAGNOSIS — R928 Other abnormal and inconclusive findings on diagnostic imaging of breast: Secondary | ICD-10-CM | POA: Diagnosis not present

## 2023-09-30 DIAGNOSIS — N6011 Diffuse cystic mastopathy of right breast: Secondary | ICD-10-CM | POA: Diagnosis not present

## 2023-10-27 ENCOUNTER — Other Ambulatory Visit (INDEPENDENT_AMBULATORY_CARE_PROVIDER_SITE_OTHER): Payer: Medicare Other

## 2023-10-27 ENCOUNTER — Ambulatory Visit (INDEPENDENT_AMBULATORY_CARE_PROVIDER_SITE_OTHER): Payer: Medicare Other | Admitting: Orthopedic Surgery

## 2023-10-27 DIAGNOSIS — M65311 Trigger thumb, right thumb: Secondary | ICD-10-CM | POA: Diagnosis not present

## 2023-10-27 DIAGNOSIS — M65312 Trigger thumb, left thumb: Secondary | ICD-10-CM

## 2023-10-27 DIAGNOSIS — M65331 Trigger finger, right middle finger: Secondary | ICD-10-CM

## 2023-10-27 MED ORDER — BETAMETHASONE SOD PHOS & ACET 6 (3-3) MG/ML IJ SUSP
6.0000 mg | INTRAMUSCULAR | Status: AC | PRN
  Administered 2023-10-27: 6 mg via INTRA_ARTICULAR

## 2023-10-27 MED ORDER — LIDOCAINE HCL 1 % IJ SOLN
1.0000 mL | INTRAMUSCULAR | Status: AC | PRN
  Administered 2023-10-27: 1 mL

## 2023-10-27 NOTE — Progress Notes (Signed)
Andrea Miles - 67 y.o. female MRN [id number]  Date of birth: January 17, 1956  Office Visit Note: Visit Date: 10/27/2023 PCP: Babs Sciara, MD Referred by: Campbell Riches, NP  Subjective: No chief complaint on file.  HPI: TEQULIA FURLAN is a pleasant 67 y.o. female who presents today for evaluation of multiple trigger digits of bilateral hands.  She is describing clicking and locking of the right long, ring and small finger, long is most severe in terms of frequency and pain.  She is also having significant clicking locking of the left thumb.  No previous treatments have been performed.  Pertinent ROS were reviewed with the patient and found to be negative unless otherwise specified above in HPI.   Visit Reason: left thumb, right long, ring, small Duration of symptoms: year Hand dominance: right Occupation: retired Diabetic: No Smoking: No Heart/Lung History: none Blood Thinners:  none  Prior Testing/EMG: none Injections (Date): none Treatments: none Prior Surgery: none   Assessment & Plan: Visit Diagnoses:  1. Bilateral trigger thumb     Plan: Extensive discussion was had the patient today regarding her multiple bilateral trigger digits.  We discussed the underlying pathophysiology and etiology of stenosing tenosynovitis.  Given that she is not undergone conservative treatment, we can begin with cortisone injections to the more symptomatic regions.  Today we will perform injection to the right long and left thumb A1 pulley regions.  I did explain that we could perform injections to the right ring and small finger in the near future.    Follow-up: No follow-ups on file.   Meds & Orders: No orders of the defined types were placed in this encounter.   Orders Placed This Encounter  Procedures   Hand/UE Inj   Hand/UE Inj   XR Hand Complete Right   XR Hand Complete Left   Ambulatory referral to Occupational Therapy     Procedures: Hand/UE Inj: R long A1 for  trigger finger on 10/27/2023 1:18 PM Indications: pain Details: 25 G needle, volar approach Medications: 1 mL lidocaine 1 %; 6 mg betamethasone acetate-betamethasone sodium phosphate 6 (3-3) MG/ML Consent was given by the patient. Patient was prepped and draped in the usual sterile fashion.    Hand/UE Inj: L thumb A1 for trigger finger on 10/27/2023 1:18 PM Indications: pain Details: 25 G needle, volar approach Medications: 1 mL lidocaine 1 %; 6 mg betamethasone acetate-betamethasone sodium phosphate 6 (3-3) MG/ML Consent was given by the patient. Patient was prepped and draped in the usual sterile fashion.          Clinical History: No specialty comments available.  She reports that she has never smoked. She has never used smokeless tobacco. No results for input(s): "HGBA1C", "LABURIC" in the last 8760 hours.  Objective:   Vital Signs: There were no vitals taken for this visit.  Physical Exam  Gen: Well-appearing, in no acute distress; non-toxic CV: Regular Rate. Well-perfused. Warm.  Resp: Breathing unlabored on room air; no wheezing. Psych: Fluid speech in conversation; appropriate affect; normal thought process  Ortho Exam Right hand: - Palpable nodules at the A1 pulley of the long, ring and small, most tender at the long finger region - There is clicking and locking of the long, ring and small finger, most notably at the long finger with deep flexion requiring manual correction  Left hand: - Palpable nodule at the A1 pulley of the thumb, associated tenderness - Notable clicking with deep IP flexion, range of motion  is limited at the IP joint, flexion approximately 45 degrees, extension neutral   Imaging: XR Hand Complete Right  Result Date: 10/27/2023 X-rays of the right hand without significant bony abnormalities  XR Hand Complete Left  Result Date: 10/27/2023 X-rays of the left hand without significant bony abnormalities   Past Medical/Family/Surgical/Social  History: Medications & Allergies reviewed per EMR, new medications updated. 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   Past Medical History:  Diagnosis Date   Abdominal pain, epigastric 01/02/2021   Gallstones    Kidney stones    PONV (postoperative nausea and vomiting)    Family History  Problem Relation Age of Onset   Cancer Other        breast   Lung cancer Father    Colon cancer Neg Hx    Colon polyps Neg Hx    Past Surgical History:  Procedure Laterality Date   ABDOMINAL HYSTERECTOMY     no cancer complete hysterectomy 2010   APPENDECTOMY     CHOLECYSTECTOMY N/A 08/28/2020   Procedure: LAPAROSCOPIC CHOLECYSTECTOMY;  Surgeon: Franky Macho, MD;  Location: [facility];  Service: General;  Laterality: N/A;   COLONOSCOPY  2008   Dr. Jena Gauss: normal   COLONOSCOPY WITH PROPOFOL N/A 09/14/2020   Dr. Jena Gauss: Single tubular adenoma removed, diverticulosis.  Next colonoscopy in 7 years.   ESOPHAGOGASTRODUODENOSCOPY (EGD) WITH PROPOFOL N/A 03/29/2021   Procedure: ESOPHAGOGASTRODUODENOSCOPY (EGD) WITH PROPOFOL;  Surgeon: Corbin Ade, MD;  Location: [facility];  Service: Endoscopy;  Laterality: N/A;  am appt, pt tested + 3/2 < 90 days   POLYPECTOMY  09/14/2020   Procedure: POLYPECTOMY;  Surgeon: Corbin Ade, MD;  Location: [facility];  Service: Endoscopy;;   Social History   Occupational History   Not on file  Tobacco Use   Smoking status: Never   Smokeless tobacco: Never  Vaping Use   Vaping status: Never Used  Substance and Sexual Activity   Alcohol use: No   Drug use: No   Sexual activity: Yes    Birth control/protection: Surgical    Sol Englert Trevor Mace, M.D. [facility] 1:20 PM

## 2023-11-03 ENCOUNTER — Ambulatory Visit: Payer: Medicare Other | Admitting: Orthopedic Surgery

## 2023-11-03 DIAGNOSIS — M65351 Trigger finger, right little finger: Secondary | ICD-10-CM

## 2023-11-03 DIAGNOSIS — M65341 Trigger finger, right ring finger: Secondary | ICD-10-CM | POA: Diagnosis not present

## 2023-11-03 MED ORDER — LIDOCAINE HCL 1 % IJ SOLN
1.0000 mL | INTRAMUSCULAR | Status: AC | PRN
  Administered 2023-11-03: 1 mL

## 2023-11-03 MED ORDER — BETAMETHASONE SOD PHOS & ACET 6 (3-3) MG/ML IJ SUSP
6.0000 mg | INTRAMUSCULAR | Status: AC | PRN
  Administered 2023-11-03: 6 mg via INTRA_ARTICULAR

## 2023-11-03 NOTE — Progress Notes (Signed)
Andrea Miles - 67 y.o. female MRN [id number]  Date of birth: 1956-06-16  Office Visit Note: Visit Date: 11/03/2023 PCP: Babs Sciara, MD Referred by: Babs Sciara, MD  Subjective: No chief complaint on file.  HPI: Andrea Miles is a pleasant 67 y.o. female who presents today for follow-up of multiple trigger digits of bilateral hands.  At her recent visit she underwent injection to the left thumb and right long finger A1 pulleys.  Has noted significant improvement at both fingers already.  She has ongoing clicking and locking of the right ring and small finger, is interested in cortisone injection to these digits today.  Pertinent ROS were reviewed with the patient and found to be negative unless otherwise specified above in HPI.   Visit Reason: Right ring and small finger Duration of symptoms: year Hand dominance: right Occupation: retired Diabetic: No Smoking: No Heart/Lung History: none Blood Thinners:  none  Prior Testing/EMG: none Injections (Date): none Treatments: none Prior Surgery: none   Assessment & Plan: Visit Diagnoses:  No diagnosis found.   Plan: Extensive discussion was had the patient today regarding her multiple trigger digits.  We discussed the underlying pathophysiology and etiology of stenosing tenosynovitis.  Given that she is not undergone conservative treatment, we can begin with cortisone injections to the more symptomatic regions.  Today we will perform injection to the right ring and small finger A1 pulley regions.  She has plans for occupational therapy to begin in December.  She will return to me as needed moving forward should her symptoms remain refractory to conservative care.   Follow-up: No follow-ups on file.   Meds & Orders: No orders of the defined types were placed in this encounter.   No orders of the defined types were placed in this encounter.    Procedures: Hand/UE Inj: R ring A1 for trigger finger on 11/03/2023  10:30 AM Indications: pain Details: 25 G needle, volar approach Medications: 1 mL lidocaine 1 %; 6 mg betamethasone acetate-betamethasone sodium phosphate 6 (3-3) MG/ML Consent was given by the patient. Patient was prepped and draped in the usual sterile fashion.    Hand/UE Inj: R small A1 for trigger finger on 11/03/2023 10:30 AM Indications: pain Details: 25 G needle, volar approach Medications: 1 mL lidocaine 1 %; 6 mg betamethasone acetate-betamethasone sodium phosphate 6 (3-3) MG/ML Consent was given by the patient. Patient was prepped and draped in the usual sterile fashion.          Clinical History: No specialty comments available.  She reports that she has never smoked. She has never used smokeless tobacco. No results for input(s): "HGBA1C", "LABURIC" in the last 8760 hours.  Objective:   Vital Signs: There were no vitals taken for this visit.  Physical Exam  Gen: Well-appearing, in no acute distress; non-toxic CV: Regular Rate. Well-perfused. Warm.  Resp: Breathing unlabored on room air; no wheezing. Psych: Fluid speech in conversation; appropriate affect; normal thought process  Ortho Exam Right hand: - Palpable nodules at the A1 pulley of the ring and small finger - There is clicking and locking of the ring and small finger    Imaging: No results found.  Past Medical/Family/Surgical/Social History: Medications & Allergies reviewed per EMR, new medications updated. 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   Past Medical History:  Diagnosis Date   Abdominal pain, epigastric  01/02/2021   Gallstones    Kidney stones    PONV (postoperative nausea and vomiting)    Family History  Problem Relation Age of Onset   Cancer Other        breast   Lung cancer Father    Colon cancer Neg Hx    Colon polyps Neg Hx    Past Surgical  History:  Procedure Laterality Date   ABDOMINAL HYSTERECTOMY     no cancer complete hysterectomy 2010   APPENDECTOMY     CHOLECYSTECTOMY N/A 08/28/2020   Procedure: LAPAROSCOPIC CHOLECYSTECTOMY;  Surgeon: Franky Macho, MD;  Location: [facility];  Service: General;  Laterality: N/A;   COLONOSCOPY  2008   Dr. Jena Gauss: normal   COLONOSCOPY WITH PROPOFOL N/A 09/14/2020   Dr. Jena Gauss: Single tubular adenoma removed, diverticulosis.  Next colonoscopy in 7 years.   ESOPHAGOGASTRODUODENOSCOPY (EGD) WITH PROPOFOL N/A 03/29/2021   Procedure: ESOPHAGOGASTRODUODENOSCOPY (EGD) WITH PROPOFOL;  Surgeon: Corbin Ade, MD;  Location: [facility];  Service: Endoscopy;  Laterality: N/A;  am appt, pt tested + 3/2 < 90 days   POLYPECTOMY  09/14/2020   Procedure: POLYPECTOMY;  Surgeon: Corbin Ade, MD;  Location: [facility];  Service: Endoscopy;;   Social History   Occupational History   Not on file  Tobacco Use   Smoking status: Never   Smokeless tobacco: Never  Vaping Use   Vaping status: Never Used  Substance and Sexual Activity   Alcohol use: No   Drug use: No   Sexual activity: Yes    Birth control/protection: Surgical    Andrea Miles, M.D.  [facility] 10:27 AM

## 2023-11-17 ENCOUNTER — Ambulatory Visit (HOSPITAL_COMMUNITY): Payer: Medicare Other | Attending: Orthopedic Surgery | Admitting: Occupational Therapy

## 2023-11-17 ENCOUNTER — Encounter (HOSPITAL_COMMUNITY): Payer: Self-pay | Admitting: Occupational Therapy

## 2023-11-17 DIAGNOSIS — M65311 Trigger thumb, right thumb: Secondary | ICD-10-CM | POA: Insufficient documentation

## 2023-11-17 DIAGNOSIS — R29898 Other symptoms and signs involving the musculoskeletal system: Secondary | ICD-10-CM | POA: Diagnosis not present

## 2023-11-17 DIAGNOSIS — M65312 Trigger thumb, left thumb: Secondary | ICD-10-CM | POA: Insufficient documentation

## 2023-11-17 NOTE — Therapy (Signed)
OUTPATIENT OCCUPATIONAL THERAPY ORTHO EVALUATION  Patient Name: Andrea Miles MRN: [id number] DOB:1956/02/08, 67 y.o., female Today's Date: 11/17/2023  PCP: Lilyan Punt, MD REFERRING PROVIDER: Samuella Cota, MD  END OF SESSION:  OT End of Session - 11/17/23 1149     Visit Number 1    Number of Visits 1    Date for OT Re-Evaluation 11/18/23    Authorization Type [employer] Medicare    OT Start Time 1101    OT Stop Time 1136    OT Time Calculation (min) 35 min    Activity Tolerance Patient tolerated treatment well    Behavior During Therapy [facility] for tasks assessed/performed             Past Medical History:  Diagnosis Date   Abdominal pain, epigastric 01/02/2021   Gallstones    Kidney stones    PONV (postoperative nausea and vomiting)    Past Surgical History:  Procedure Laterality Date   ABDOMINAL HYSTERECTOMY     no cancer complete hysterectomy 2010   APPENDECTOMY     CHOLECYSTECTOMY N/A 08/28/2020   Procedure: LAPAROSCOPIC CHOLECYSTECTOMY;  Surgeon: Franky Macho, MD;  Location: [facility];  Service: General;  Laterality: N/A;   COLONOSCOPY  2008   Dr. Jena Gauss: normal   COLONOSCOPY WITH PROPOFOL N/A 09/14/2020   Dr. Jena Gauss: Single tubular adenoma removed, diverticulosis.  Next colonoscopy in 7 years.   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 female who presents with multiple trigger digits of bilateral hands.  At her recent visit she underwent injection to the left thumb and right long finger A1 pulleys.  Has noted significant improvement at both fingers already.   PRECAUTIONS: None  WEIGHT BEARING RESTRICTIONS: No  PAIN:  Are you having pain? No  FALLS: Has patient fallen in last 6 months? No  LIVING ENVIRONMENT: Lives with: lives alone Lives in: House/apartment  PLOF: Independent  PATIENT GOALS: To get the fingers to keep working  NEXT MD VISIT: None  OBJECTIVE:  Note: Objective measures were completed at Evaluation unless otherwise noted.  HAND DOMINANCE: Right  ADLs: WFL  FUNCTIONAL OUTCOME MEASURES: Upper Extremity Functional Scale (UEFS): 0/80   UPPER EXTREMITY ROM:     BUE wrist: WFL Able to make 100% of a fist with BUE   UPPER EXTREMITY MMT:     MMT Right eval Left eval  Wrist flexion 5/5 5/5  Wrist extension 5/5 5/5  Wrist ulnar deviation 5/5 5/5  Wrist radial deviation 5/5 5/5  Wrist pronation 5/5 5/5  Wrist supination 5/5 5/5  (Blank rows = not tested)  HAND FUNCTION: Grip strength: Right: 65 lbs; Left: 60 lbs, Lateral pinch: Right: 15  lbs, Left: 14 lbs, and 3 point pinch: Right: 15 lbs, Left: 10 lbs  COORDINATION: 9 Hole Peg test: Right: 21.56 sec; Left: 25.70 sec  SENSATION: WFL  EDEMA: No swelling note  OBSERVATIONS: Mild fascial restrictions along the R pinky tendon and L thumb   TODAY'S TREATMENT:                                                                                                                              DATE: 11/17/23: Evaluation Only    PATIENT EDUCATION: Education details: Finger and Thumb ROM and light  strengthening Person educated: Patient Education method: Explanation, Demonstration, and Handouts Education comprehension: verbalized understanding and returned demonstration  HOME EXERCISE PROGRAM: Finger and Thumb ROM and light strengthening  GOALS: Goals reviewed with patient? Yes  SHORT TERM GOALS: Target date: 11/17/23  Pt will be educated on and provided an HEP for BUE finger ROM and strengthening  Goal status: INITIAL/MET   ASSESSMENT:  CLINICAL IMPRESSION: Patient is a 67 y.o. female who was seen today for occupational therapy evaluation for multiple trigger fingers. Pt reports no deficits with ADL's or IADL's. Recently had multiple shots in each finger for trigger point release. Pt presented to OT evaluation for an HEP and education for pain relief.  She has no further skilled OT needs at this time and will be discharged from OT.   PERFORMANCE DEFICITS: in functional skills including dexterity, strength, pain, and fascial restrictions.  IMPAIRMENTS: are limiting patient from  Pt reports no limitations .   COMORBIDITIES: has no other co-morbidities that affects occupational performance. Patient will benefit from skilled OT to address above impairments and improve overall function.  MODIFICATION OR ASSISTANCE TO COMPLETE EVALUATION: No modification of tasks or assist necessary to complete an evaluation.  OT OCCUPATIONAL PROFILE AND HISTORY: Problem focused assessment: Including review of records relating to presenting problem.  CLINICAL DECISION MAKING: LOW - limited treatment options, no task modification necessary  REHAB POTENTIAL: Excellent  EVALUATION COMPLEXITY: Low      PLAN:  OT FREQUENCY: one time visit  OT DURATION: other: Evaluation Only  PLANNED INTERVENTIONS: patient/family education  RECOMMENDED OTHER SERVICES: N/A  CONSULTED AND AGREED WITH PLAN OF CARE: Patient  PLAN FOR NEXT SESSION: Discharge   Muriah Harsha Bing Plume, OTR/L [facility] [phone] Benedetto Ryder Rosemarie Beath, OT 11/17/2023, 11:50 AM

## 2024-08-26 ENCOUNTER — Ambulatory Visit: Admitting: Orthopedic Surgery

## 2024-08-26 DIAGNOSIS — M65341 Trigger finger, right ring finger: Secondary | ICD-10-CM | POA: Diagnosis not present

## 2024-08-26 DIAGNOSIS — M65331 Trigger finger, right middle finger: Secondary | ICD-10-CM | POA: Diagnosis not present

## 2024-08-26 DIAGNOSIS — M65311 Trigger thumb, right thumb: Secondary | ICD-10-CM | POA: Diagnosis not present

## 2024-08-26 DIAGNOSIS — M65351 Trigger finger, right little finger: Secondary | ICD-10-CM

## 2024-08-26 MED ORDER — LIDOCAINE HCL 1 % IJ SOLN
1.0000 mL | INTRAMUSCULAR | Status: AC | PRN
  Administered 2024-08-26: 1 mL

## 2024-08-26 MED ORDER — BETAMETHASONE SOD PHOS & ACET 6 (3-3) MG/ML IJ SUSP
6.0000 mg | INTRAMUSCULAR | Status: AC | PRN
  Administered 2024-08-26: 6 mg via INTRA_ARTICULAR

## 2024-08-26 NOTE — Progress Notes (Signed)
 Andrea Miles - 68 y.o. female MRN [id number]  Date of birth: 1956-08-28  Office Visit Note: Visit Date: 08/26/2024 PCP: Alphonsa Glendia LABOR, MD Referred by: Alphonsa Glendia LABOR, MD  Subjective: Chief Complaint  Patient presents with   Right Thumb - Pain   Right Middle Finger - Pain   Right Ring Finger - Pain   Right Little Finger - Pain   Left Middle Finger - Pain   HPI: Andrea Miles is a pleasant 68 y.o. female who returns today for follow-up of multiple trigger digits of bilateral hands.  Patient here today for return with multiple trigger fingers. Right thumb, middle, ring, and pinky finger; and left middle finger. Asking for injections today. Patient is not diabetic.   Pertinent ROS were reviewed with the patient and found to be negative unless otherwise specified above in HPI.     Assessment & Plan: Visit Diagnoses:  1. Trigger finger, right ring finger   2. Trigger finger, right little finger   3. Trigger finger, right middle finger   4. Trigger thumb, right thumb      Plan: Extensive discussion was had the patient today regarding her multiple trigger digits.  We discussed the underlying pathophysiology and etiology of stenosing tenosynovitis.    She is interested in repeat injections today.  Injections were performed today to the right thumb, long, ring and small finger for symptom relief.  I did explain that should he is continue to recur, she would be an appropriate candidate for consideration of multiple trigger digit releases in the future.  She expressed understanding.   Follow-up: No follow-ups on file.   Meds & Orders: No orders of the defined types were placed in this encounter.   No orders of the defined types were placed in this encounter.    Procedures: Hand/UE Inj: R thumb A1 for trigger finger on 08/26/2024 9:20 PM Indications: pain Details: 25 G needle, volar approach Medications: 1 mL lidocaine  1 %; 6 mg betamethasone  acetate-betamethasone  sodium  phosphate 6 (3-3) MG/ML Outcome: tolerated well, no immediate complications Consent was given by the patient. Patient was prepped and draped in the usual sterile fashion.    Hand/UE Inj: R long A1 for trigger finger on 08/26/2024 9:20 PM Indications: pain Details: 25 G needle, volar approach Medications: 1 mL lidocaine  1 %; 6 mg betamethasone  acetate-betamethasone  sodium phosphate  6 (3-3) MG/ML Consent was given by the patient. Patient was prepped and draped in the usual sterile fashion.    Hand/UE Inj: R ring A1 for trigger finger on 08/26/2024 9:20 PM Indications: pain Details: 25 G needle, volar approach Medications: 1 mL lidocaine  1 %; 6 mg betamethasone  acetate-betamethasone  sodium phosphate  6 (3-3) MG/ML Outcome: tolerated well, no immediate complications Consent was given by the patient. Patient was prepped and draped in the usual sterile fashion.          Clinical History: No specialty comments available.  She reports that she has never smoked. She has never used smokeless tobacco. No results for input(s): HGBA1C, LABURIC in the last 8760 hours.  Objective:   Vital Signs: There were no vitals taken for this visit.  Physical Exam  Gen: Well-appearing, in no acute distress; non-toxic CV: Regular Rate. Well-perfused. Warm.  Resp: Breathing unlabored on room air; no wheezing. Psych: Fluid speech in conversation; appropriate affect; normal thought process  Ortho Exam Right hand: - Palpable nodules at the A1 pulley of the ring and small finger - There is clicking and locking  of the ring and small finger    Imaging: No results found.  Past Medical/Family/Surgical/Social History: Medications & Allergies reviewed per EMR, new medications updated. 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   Past Medical History:  Diagnosis Date   Abdominal  pain, epigastric 01/02/2021   Gallstones    Kidney stones    PONV (postoperative nausea and vomiting)    Family History  Problem Relation Age of Onset   Cancer Other        breast   Lung cancer Father    Colon cancer Neg Hx    Colon polyps Neg Hx    Past Surgical History:  Procedure Laterality Date   ABDOMINAL HYSTERECTOMY     no cancer complete hysterectomy 2010   APPENDECTOMY     CHOLECYSTECTOMY N/A 08/28/2020   Procedure: LAPAROSCOPIC CHOLECYSTECTOMY;  Surgeon: Mavis Anes, MD;  Location: [facility];  Service: General;  Laterality: N/A;   COLONOSCOPY  2008   Dr. Shaaron: normal   COLONOSCOPY WITH PROPOFOL  N/A 09/14/2020   Dr. Shaaron: Single tubular adenoma removed, diverticulosis.  Next colonoscopy in 7 years.   ESOPHAGOGASTRODUODENOSCOPY (EGD) WITH PROPOFOL  N/A 03/29/2021   Procedure: ESOPHAGOGASTRODUODENOSCOPY (EGD) WITH PROPOFOL ;  Surgeon: Shaaron Lamar HERO, MD;  [facility];  Service: Endoscopy;  Laterality: N/A;  am appt, pt tested + 3/2 < 90 days   POLYPECTOMY  09/14/2020   Procedure: POLYPECTOMY;  Surgeon: Shaaron Lamar HERO, MD;  [facility]: [facility];  Service: Endoscopy;;   Social History   Occupational History   Not on file  Tobacco Use   Smoking status: Never   Smokeless tobacco: Never  Vaping Use   Vaping status: Never Used  Substance and Sexual Activity   Alcohol use: No   Drug use: No   Sexual activity: Yes    Birth control/protection: Surgical    Mertha Clyatt Afton Alderton, M.D. [facility]

## 2024-08-31 ENCOUNTER — Ambulatory Visit: Admitting: Orthopedic Surgery

## 2024-09-08 ENCOUNTER — Encounter: Payer: Self-pay | Admitting: Emergency Medicine

## 2024-09-08 ENCOUNTER — Other Ambulatory Visit: Payer: Self-pay

## 2024-09-08 ENCOUNTER — Ambulatory Visit: Payer: Self-pay

## 2024-09-08 ENCOUNTER — Ambulatory Visit
Admission: EM | Admit: 2024-09-08 | Discharge: 2024-09-08 | Disposition: A | Discharge status: Home / Self Care | Attending: Nurse Practitioner | Admitting: Nurse Practitioner

## 2024-09-08 DIAGNOSIS — R399 Unspecified symptoms and signs involving the genitourinary system: Secondary | ICD-10-CM | POA: Diagnosis present

## 2024-09-08 DIAGNOSIS — R10A1 Flank pain, right side: Secondary | ICD-10-CM | POA: Diagnosis not present

## 2024-09-08 DIAGNOSIS — Z87442 Personal history of urinary calculi: Secondary | ICD-10-CM | POA: Insufficient documentation

## 2024-09-08 LAB — POCT URINE DIPSTICK
Bilirubin, UA: NEGATIVE
Glucose, UA: NEGATIVE mg/dL
Ketones, POC UA: NEGATIVE mg/dL
Nitrite, UA: NEGATIVE
POC PROTEIN,UA: NEGATIVE
Spec Grav, UA: 1.01 (ref 1.010–1.025)
Urobilinogen, UA: 0.2 U/dL
pH, UA: 6.5 (ref 5.0–8.0)

## 2024-09-08 MED ORDER — CEFPODOXIME PROXETIL 200 MG PO TABS: 200.0000 mg | ORAL_TABLET | Freq: Two times a day (BID) | ORAL | 0 refills | Status: AC

## 2024-09-08 MED ORDER — TAMSULOSIN HCL 0.4 MG PO CAPS: 0.4000 mg | ORAL_CAPSULE | Freq: Every day | ORAL | 0 refills | Status: AC

## 2024-09-08 NOTE — Telephone Encounter (Signed)
 FYI Only or Action Required?: FYI only for provider.  Patient was last seen in primary care on 09/17/2023 by Mauro Elveria BROCKS, NP.  Called Nurse Triage reporting Urinary Frequency.  Symptoms began yesterday.  Interventions attempted: OTC medications: Aleve.  Symptoms are: unchanged.  Triage Disposition: See HCP Within 4 Hours (Or PCP Triage)  Patient/caregiver understands and will follow disposition?: Yes  **referred to UC as there are no avil. Appts until 10/3**      Copied from CRM #[id number]. Topic: Clinical - Red Word Triage >> Sep 08, 2024  9:12 AM Suzen RAMAN wrote: Red Word that prompted transfer to Nurse Triage: pain in right side, burning and aching when urinating. Requesting an appt. ([facility]) Reason for Disposition  Side (flank) or lower back pain present  Answer Assessment - Initial Assessment Questions 1. SYMPTOM: What's the main symptom you're concerned about? (e.g., frequency, incontinence)     pain in right side, burning and aching when urinating, frequency  2. ONSET: When did the  symptoms  start?     X 1 day   3. PAIN: Is there any pain? If Yes, ask: How bad is it? (Scale: 1-10; mild, moderate, severe)     7/10  4. CAUSE: What do you think is causing the symptoms?     Possible UTI  5. OTHER SYMPTOMS: Do you have any other symptoms? (e.g., blood in urine, fever, flank pain, pain with urination)     Flank pain, pain with urination   Patient is taking Aleve for the symptoms. Patient will be seen today in UC as there are no available appts. Until 10/3. She agrees with plan of care.  Protocols used: Urinary Symptoms-A-AH

## 2024-09-08 NOTE — ED Provider Notes (Signed)
 [facility]    CSN: [id number] Arrival date & time: 09/08/24  1021      History   Chief Complaint Chief Complaint  Patient presents with   Urinary Frequency    HPI Andrea Miles is a 68 y.o. female.   The history is provided by the patient.   Patient presents with a 1 week history of pain with urination, urinary frequency, and right flank pain, and right upper quadrant abdominal pain.  She denies fever, chills, chest pain, nausea, vomiting, diarrhea, hematuria, decreased urine stream, or low back pain.  Patient reports prior history of kidney stones.  States I have not had them in a while.  She also reports that she does not have a history of recurrent urinary tract infections.  So far, she has not taken any medications for her symptoms. 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 Shaaron: normal   COLONOSCOPY WITH PROPOFOL  N/A 09/14/2020   Dr. Shaaron: Single tubular adenoma removed, diverticulosis.  Next colonoscopy in 7 years.   ESOPHAGOGASTRODUODENOSCOPY (EGD) WITH PROPOFOL  N/A 03/29/2021   Procedure: ESOPHAGOGASTRODUODENOSCOPY (EGD) WITH PROPOFOL ;  Surgeon: Shaaron Lamar HERO, MD;  [facility];  Service: Endoscopy;  Laterality: N/A;  am appt, pt tested + 3/2 < 90 days   POLYPECTOMY  09/14/2020   Procedure: POLYPECTOMY;  Surgeon: Shaaron Lamar HERO, MD;   [facility]: [facility];  Service: Endoscopy;;    OB History   No obstetric history on file.      Home Medications    Prior to Admission medications   Medication Sig Start Date End Date Taking? Authorizing Provider  cefpodoxime (VANTIN) 200 MG tablet Take 1 tablet (200 mg total) by mouth 2 (two) times daily for 10 days. 09/08/24 09/18/24 Yes Leath-Warren, Etta PARAS, NP  tamsulosin (FLOMAX) 0.4 MG CAPS capsule Take 1 capsule (0.4 mg total) by mouth daily after supper. 09/08/24  Yes Leath-Warren, Etta PARAS, NP  APPLE CIDER VINEGAR PO Take 450 mg by mouth daily.    [provider]  aspirin EC 81 MG tablet Take 81 mg by mouth daily. Swallow whole.     [provider]  BIOTIN PO Take 1 tablet by mouth daily.    [provider]  diazepam  (VALIUM ) 10 MG tablet Take 1 tablet (10 mg total) by mouth at bedtime as needed for sleep. 09/19/23   Hoskins, Carolyn C, NP  Glucosamine HCl 1500 MG TABS Take 1,500 mg by mouth daily.    [provider]  hydrOXYzine  (ATARAX ) 50 MG tablet Take 1 tablet (50 mg total) by mouth 3 (three) times daily as needed (Allergies). 09/17/23   Mauro Elveria BROCKS, NP  ketoconazole  (NIZORAL ) 2 % cream Apply topically daily as needed for irritation. 09/19/23   Hoskins, Carolyn C, NP  Multiple Vitamin (MULTIVITAMIN WITH MINERALS) TABS tablet Take 1  tablet by mouth daily.    [provider]  multivitamin-lutein (OCUVITE-LUTEIN) CAPS capsule Take 1 capsule by mouth daily.    [provider]  Omega-3 Fatty Acids (OMEGA 3 PO) Take 250 mg by mouth daily.    [provider]  vitamin B-12 (CYANOCOBALAMIN) 1000 MCG tablet Take 1,000 mcg by mouth daily.    [provider]    Family History Family History  Problem Relation Age of Onset   Cancer Other        breast   Lung cancer Father    Colon cancer Neg Hx    Colon polyps Neg Hx     Social History Social History   Tobacco Use   Smoking status: Never    Smokeless tobacco: Never  Vaping Use   Vaping status: Never Used  Substance Use Topics   Alcohol use: No   Drug use: No     Allergies   Amitriptyline, Codeine, Darvocet [propoxyphene n-acetaminophen ], Dolamide [chlorpropamide], Oxycodone, Percocet [oxycodone-acetaminophen ], Sulfa antibiotics, Toradol [ketorolac tromethamine], and Vicodin [hydrocodone-acetaminophen ]   Review of Systems Review of Systems Per HPI  Physical Exam Triage Vital Signs ED Triage Vitals [09/08/24 1028]  Encounter Vitals Group     BP (!) 151/90     Girls Systolic BP Percentile      Girls Diastolic BP Percentile      Boys Systolic BP Percentile      Boys Diastolic BP Percentile      Pulse Rate 90     Resp 20     Temp 98.2 F (36.8 C)     Temp Source Oral     SpO2 94 %     Weight      Height      Head Circumference      Peak Flow      Pain Score 9     Pain Loc      Pain Education      Exclude from Growth Chart    No data found.  Updated Vital Signs BP (!) 151/90 (BP Location: Right Arm)   Pulse 90   Temp 98.2 F (36.8 C) (Oral)   Resp 20   SpO2 94%   Visual Acuity Right Eye Distance:   Left Eye Distance:   Bilateral Distance:    Right Eye Near:   Left Eye Near:    Bilateral Near:     Physical Exam Vitals and nursing note reviewed.  Constitutional:      General: She is not in acute distress.    Appearance: Normal appearance.  HENT:     Head: Normocephalic.  Eyes:     Extraocular Movements: Extraocular movements intact.     Pupils: Pupils are equal, round, and reactive to light.  Cardiovascular:     Rate and Rhythm: Normal rate and regular rhythm.     Pulses: Normal pulses.     Heart sounds: Normal heart sounds.  Pulmonary:     Effort: Pulmonary effort is normal. No respiratory distress.     Breath sounds: Normal breath sounds. No stridor. No wheezing, rhonchi or rales.  Abdominal:     General: Bowel sounds are normal.     Palpations: Abdomen is soft.     Tenderness:  There is abdominal tenderness. There is right CVA tenderness.  Musculoskeletal:     Cervical back: Normal range of motion.  Skin:    General: Skin is warm and dry.  Neurological:     General: No focal deficit present.  Mental Status: She is alert and oriented to person, place, and time.  Psychiatric:        Mood and Affect: Mood normal.        Behavior: Behavior normal.      UC Treatments / Results  Labs (all labs ordered are listed, but only abnormal results are displayed) Labs Reviewed  POCT URINE DIPSTICK - Abnormal; Notable for the following components:      Result Value   Blood, UA moderate (*)    Leukocytes, UA Large (3+) (*)    All other components within normal limits  URINE CULTURE    EKG   Radiology No results found.  Procedures Procedures (including critical care time)  Medications Ordered in UC Medications - No data to display  Initial Impression / Assessment and Plan / UC Course  I have reviewed the triage vital signs and the nursing notes.  Pertinent labs & imaging results that were available during my care of the patient were reviewed by me and considered in my medical decision making (see chart for details).  The patient is well-appearing, she is in no acute distress, vital signs are stable.  On exam, she does have right CVA tenderness and suprapubic tenderness.  She does have a history of kidney stones.  Concern for possible pyelonephritis versus kidney stone given her right CVA tenderness.  Will treat with cefpodoxime 200 mg to cover for pyelonephritis and tamsulosin 0.4 mg to cover for possible kidney stone.  Urine culture has been ordered.  Supportive care recommendations were provided discussed with the patient to include over-the-counter Tylenol , fluids, and developing a toileting schedule.  Patient was advised if the urine culture is negative and she is continuing to experience symptoms, recommend follow-up with her PCP to discuss possible  referral to urology.  Patient was also given strict ER follow-up precautions.  Patient was in agreement with this plan of care and verbalizes understanding.  All questions were answered.  Patient stable for discharge.   Final Clinical Impressions(s) / UC Diagnoses   Final diagnoses:  UTI symptoms  Right flank pain  History of kidney stones     Discharge Instructions      A urine culture has been ordered.  You will be contacted when the results of the urine culture are received.  If your urine culture is negative and you are continuing to experience symptoms, please follow-up with your primary care physician for further evaluation. Take medication as prescribed. You may take over-the-counter Tylenol  as needed for pain, fever, or general discomfort. Make sure you are drinking at least 8-10 8 ounce glasses of water daily     ED Prescriptions     Medication Sig Dispense Auth. Provider   cefpodoxime (VANTIN) 200 MG tablet Take 1 tablet (200 mg total) by mouth 2 (two) times daily for 10 days. 20 tablet Leath-Warren, Etta PARAS, NP   tamsulosin (FLOMAX) 0.4 MG CAPS capsule Take 1 capsule (0.4 mg total) by mouth daily after supper. 30 capsule Leath-Warren, Etta PARAS, NP      PDMP not reviewed this encounter.   Gilmer Etta PARAS, NP 09/08/24 1109

## 2024-09-08 NOTE — Discharge Instructions (Addendum)
 A urine culture has been ordered.  You will be contacted when the results of the urine culture are received.  If your urine culture is negative and you are continuing to experience symptoms, please follow-up with your primary care physician for further evaluation. Take medication as prescribed. You may take over-the-counter Tylenol  as needed for pain, fever, or general discomfort. Make sure you are drinking at least 8-10 8 ounce glasses of water daily

## 2024-09-08 NOTE — ED Triage Notes (Signed)
 Pt reports right flank pain, urinary frequency, dysuria since yesterday. Denies any known fever.

## 2024-09-09 NOTE — Telephone Encounter (Signed)
 Noted patient going to urgent care

## 2024-09-11 LAB — URINE CULTURE: Culture: >=100000 — AB

## 2024-09-14 ENCOUNTER — Other Ambulatory Visit (HOSPITAL_COMMUNITY): Payer: Self-pay | Admitting: Family Medicine

## 2024-09-14 ENCOUNTER — Ambulatory Visit (HOSPITAL_COMMUNITY): Payer: Self-pay

## 2024-09-14 DIAGNOSIS — Z1231 Encounter for screening mammogram for malignant neoplasm of breast: Secondary | ICD-10-CM

## 2024-10-01 ENCOUNTER — Ambulatory Visit

## 2024-10-04 ENCOUNTER — Ambulatory Visit (HOSPITAL_COMMUNITY)
Admission: RE | Admit: 2024-10-04 | Discharge: 2024-10-04 | Disposition: A | Discharge status: Home / Self Care | Source: Ambulatory Visit | Attending: Family Medicine | Admitting: Family Medicine

## 2024-10-04 ENCOUNTER — Encounter: Payer: Self-pay | Admitting: Nurse Practitioner

## 2024-10-04 ENCOUNTER — Encounter (HOSPITAL_COMMUNITY): Payer: Self-pay

## 2024-10-04 ENCOUNTER — Ambulatory Visit (INDEPENDENT_AMBULATORY_CARE_PROVIDER_SITE_OTHER): Admitting: Nurse Practitioner

## 2024-10-04 VITALS — BP 123/83 | HR 75 | Ht 68.0 in | Wt 193.0 lb

## 2024-10-04 DIAGNOSIS — Z1231 Encounter for screening mammogram for malignant neoplasm of breast: Secondary | ICD-10-CM | POA: Diagnosis present

## 2024-10-04 DIAGNOSIS — E7800 Pure hypercholesterolemia, unspecified: Secondary | ICD-10-CM | POA: Diagnosis not present

## 2024-10-04 DIAGNOSIS — Z0001 Encounter for general adult medical examination with abnormal findings: Secondary | ICD-10-CM

## 2024-10-04 DIAGNOSIS — F5104 Psychophysiologic insomnia: Secondary | ICD-10-CM

## 2024-10-04 DIAGNOSIS — R5383 Other fatigue: Secondary | ICD-10-CM | POA: Diagnosis not present

## 2024-10-04 DIAGNOSIS — B369 Superficial mycosis, unspecified: Secondary | ICD-10-CM

## 2024-10-04 DIAGNOSIS — Z01419 Encounter for gynecological examination (general) (routine) without abnormal findings: Secondary | ICD-10-CM

## 2024-10-04 DIAGNOSIS — Z0000 Encounter for general adult medical examination without abnormal findings: Secondary | ICD-10-CM

## 2024-10-04 MED ORDER — HYDROXYZINE HCL 50 MG PO TABS: 50.0000 mg | ORAL_TABLET | Freq: Three times a day (TID) | ORAL | 3 refills | Status: AC | PRN

## 2024-10-04 MED ORDER — DIAZEPAM 10 MG PO TABS: 10.0000 mg | ORAL_TABLET | Freq: Every evening | ORAL | 5 refills | Status: AC | PRN

## 2024-10-04 MED ORDER — KETOCONAZOLE 2 % EX CREA: TOPICAL_CREAM | Freq: Every day | CUTANEOUS | 5 refills | Status: AC | PRN

## 2024-10-04 NOTE — Progress Notes (Signed)
   Subjective:    Patient ID: Andrea Miles, female    DOB: 08/05/1956, 68 y.o.   MRN: [id number] CC: patient arrived for her AWV HPI AWV- Annual Wellness Visit  The patient was seen for their annual wellness visit. The patient's past medical history, surgical history, and family history were reviewed. Pertinent vaccines were reviewed ( tetanus, pneumonia, shingles, flu) The patient's medication list was reviewed and updated.  The height and weight were entered.  BMI recorded in electronic record elsewhere  Cognitive screening was completed. Outcome of Mini - Cog:    Falls /depression screening electronically recorded within record elsewhere  Current tobacco usage:none (All patients who use tobacco were given written and verbal information on quitting)  Recent listing of emergency department/hospitalizations over the past year were reviewed.  current specialist the patient sees on a regular basis: no   Medicare annual wellness visit patient questionnaire was reviewed.  A written screening schedule for the patient for the next 5-10 years was given. Appropriate discussion of followup regarding next visit was discussed.           Objective:            Assessment & Plan:

## 2024-10-04 NOTE — Progress Notes (Signed)
 Subjective:   Andrea Miles is a 68 y.o. female who presents for Medicare Annual (Subsequent) preventive examination. She states that she does not have any urgent concerns. She partakes in a healthy diet that consist of mostly veggies and chicken. 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 She is sleeping about 6/7 hours nightly. She does not have a partner currently and has not been sexually active in the past several years.   Visit Complete: In person  Patient Medicare AWV questionnaire was completed by the patient on 09/09/2024; I have confirmed that all information answered by patient is correct and no changes since this date.   Review of Systems  Constitutional:  Positive for malaise/fatigue. Negative for fever.  HENT:  Negative for sore throat.   Respiratory:  Negative for cough, shortness of breath and wheezing.   Cardiovascular:  Negative for chest pain and leg swelling.  Gastrointestinal:  Negative for constipation, diarrhea, nausea and vomiting.  Genitourinary:  Negative for dysuria, frequency and urgency.       No vaginal discharge. No rash, lesions, irritation in vulvar area.       Objective:    Today's Vitals   10/04/24 0842  BP: 123/83  Pulse: 75  SpO2: 99%  Weight: 193 lb (87.5 kg)  Height: 5' 8 (1.727 m)   Body mass index is 29.35 kg/m.     03/29/2021    7:19 AM 09/14/2020    6:51 AM 08/28/2020    6:32 AM  Advanced Directives  Does Patient Have a Medical Advance Directive? No Yes Yes  Type of Advance Directive  Living will Living will  Does patient want to make changes to medical advance directive?   No - Patient declined  Would patient like information on creating a medical advance directive? No - Patient declined        Current Medications (verified) Outpatient Encounter Medications as of 10/04/2024  Medication Sig   APPLE CIDER VINEGAR PO Take 450 mg by mouth daily.   aspirin EC 81 MG tablet Take 81 mg by mouth daily.  Swallow whole.    BIOTIN PO Take 1 tablet by mouth daily.   diazepam  (VALIUM ) 10 MG tablet Take 1 tablet (10 mg total) by mouth at bedtime as needed for sleep.   Glucosamine HCl 1500 MG TABS Take 1,500 mg by mouth daily.   hydrOXYzine  (ATARAX ) 50 MG tablet Take 1 tablet (50 mg total) by mouth 3 (three) times daily as needed (Allergies).   ketoconazole  (NIZORAL ) 2 % cream Apply topically daily as needed for irritation.   Multiple Vitamin (MULTIVITAMIN WITH MINERALS) TABS tablet Take 1 tablet by mouth daily.   multivitamin-lutein (OCUVITE-LUTEIN) CAPS capsule Take 1 capsule by mouth daily.   Omega-3 Fatty Acids (OMEGA 3 PO) Take 250 mg by mouth daily.   tamsulosin (FLOMAX) 0.4 MG CAPS capsule Take 1 capsule (0.4 mg total) by mouth daily after supper.   vitamin B-12 (CYANOCOBALAMIN) 1000 MCG tablet Take 1,000 mcg by mouth daily.   No facility-administered encounter medications on file as of 10/04/2024.    Allergies (verified) Amitriptyline, Codeine, Darvocet [propoxyphene n-acetaminophen ], Dolamide [chlorpropamide], Oxycodone, Percocet [oxycodone-acetaminophen ], Sulfa antibiotics, Toradol [ketorolac tromethamine], and Vicodin [hydrocodone-acetaminophen ]   History: Past Medical History:  Diagnosis Date   Abdominal pain, epigastric 01/02/2021   Gallstones    Kidney stones    PONV (postoperative nausea and vomiting)    Past Surgical History:  Procedure Laterality Date   ABDOMINAL HYSTERECTOMY  no cancer complete hysterectomy 2010   APPENDECTOMY     CHOLECYSTECTOMY N/A 08/28/2020   Procedure: LAPAROSCOPIC CHOLECYSTECTOMY;  Surgeon: Mavis Anes, MD;  Location: [facility];  Service: General;  Laterality: N/A;   COLONOSCOPY  2008   Dr. Shaaron: normal   COLONOSCOPY WITH PROPOFOL  N/A 09/14/2020   Dr. Shaaron: Single tubular adenoma removed, diverticulosis.  Next colonoscopy in 7 years.   ESOPHAGOGASTRODUODENOSCOPY (EGD) WITH PROPOFOL  N/A 03/29/2021   Procedure: ESOPHAGOGASTRODUODENOSCOPY (EGD)  WITH PROPOFOL ;  Surgeon: Shaaron Lamar HERO, MD;  Location: [facility];  Service: Endoscopy;  Laterality: N/A;  am appt, pt tested + 3/2 < 90 days   POLYPECTOMY  09/14/2020   Procedure: POLYPECTOMY;  Surgeon: Shaaron Lamar HERO, MD;  Location: [facility];  Service: Endoscopy;;   Family History  Problem Relation Age of Onset   Cancer Other        breast   Lung cancer Father    Colon cancer Neg Hx    Colon polyps Neg Hx     Tobacco Counseling NA  Patient Care Team: Alphonsa Glendia LABOR, MD as PCP - General (Family Medicine)     Assessment:   This is a routine wellness examination for Andrea Miles   Depression Screen    10/04/2024    8:44 AM 09/17/2023    1:18 PM 08/13/2022    1:27 PM 08/08/2021    1:26 PM 07/20/2020   10:42 AM 06/28/2019    9:44 AM 06/25/2018    9:53 AM  PHQ 2/9 Scores  PHQ - 2 Score 0 1 0 0 0 0 0  PHQ- 9 Score 3 3         Fall Risk    10/04/2024    8:44 AM 09/17/2023    1:18 PM 08/13/2022    1:27 PM 08/08/2021    1:26 PM 08/10/2020    1:15 PM  Fall Risk   Falls in the past year? 0 0 0 0 0  Number falls in past yr: 0  0 0   Injury with Fall? 0  0 0   Risk for fall due to : No Fall Risks  No Fall Risks No Fall Risks   Follow up Falls evaluation completed  Falls evaluation completed  Falls evaluation completed  Falls evaluation completed      Data saved with a previous flowsheet row definition        Cognitive Function: Recorded in chart       Immunizations Immunization History  Administered Date(s) Administered   Influenza,inj,Quad PF,6+ Mos 09/24/2018   Influenza-Unspecified 08/29/2015, 09/30/2024   Moderna Sars-Covid-2 Vaccination 02/21/2020, 03/23/2020   PNEUMOCOCCAL CONJUGATE-20 09/17/2023   Pfizer(Comirnaty)Fall Seasonal Vaccine 12 years and older 09/30/2024   Pneumococcal Polysaccharide-23 07/20/2020   Td 04/28/2003   Tdap 09/24/2018   Zoster Recombinant(Shingrix ) 01/03/2021     Qualifies for Shingles Vaccine? Yes    Shingrix  Completed?: No.       Screening Tests Health Maintenance  Topic Date Due   Hepatitis C Screening  Never done   Zoster Vaccines- Shingrix  (2 of 2) 02/28/2021   COVID-19 Vaccine (4 - 2025-26 season) 03/31/2025   Mammogram  09/17/2025   Medicare Annual Wellness (AWV)  10/04/2025   Colonoscopy  09/15/2027   DTaP/Tdap/Td (3 - Td or Tdap) 09/24/2028   Pneumococcal Vaccine: 50+ Years  Completed   Influenza Vaccine  Completed   DEXA SCAN  Completed   Meningococcal B Vaccine  Aged Out    Health Maintenance  Health  Maintenance Due  Topic Date Due   Hepatitis C Screening  Never done   Zoster Vaccines- Shingrix  (2 of 2) 02/28/2021    Colorectal cancer screening: Type of screening: Colonoscopy. Completed 09/27/20. Repeat every 7 years  Mammogram status: Completed 10/04/2024. Repeat every year  Bone Density status: Completed 08/13/22. Results reflect: Bone density results: NORMAL. Repeat every 2 years. Deferred today and will complete 2026.   Additional Screening:   Vision Screening: Recommended annual ophthalmology exams for early detection of glaucoma and other disorders of the eye. Is the patient up to date with their annual eye exam?  Yes . Next exam due 2026.   Dental Screening: Recommended annual dental exams for proper oral hygiene  Physical Exam Vitals and nursing note reviewed.  Constitutional:      General: She is not in acute distress.    Appearance: Normal appearance.  Neck:     Comments: Thyroid : palpable, non-tender, no mass/goiter detected.  Cardiovascular:     Rate and Rhythm: Normal rate and regular rhythm.     Heart sounds: Normal heart sounds.  Pulmonary:     Effort: Pulmonary effort is normal.     Breath sounds: Normal breath sounds.  Abdominal:     Palpations: Abdomen is soft.     Tenderness: There is no abdominal tenderness.  Genitourinary:    Comments: Defers GU exam. Denies any problems.  Musculoskeletal:     Cervical back: Neck supple. No tenderness.     Right  lower leg: No edema.     Left lower leg: No edema.  Skin:    General: Skin is warm and dry.  Neurological:     Mental Status: She is alert and oriented to person, place, and time.  Psychiatric:        Mood and Affect: Mood normal.        Behavior: Behavior normal.        Thought Content: Thought content normal.     Today's Vitals   10/04/24 0842  BP: 123/83  Pulse: 75  SpO2: 99%  Weight: 193 lb (87.5 kg)  Height: 5' 8 (1.727 m)   Body mass index is 29.35 kg/m.     Plan:     1. Encounter for subsequent annual wellness visit (AWV) in Medicare patient (Primary) I have personally reviewed and noted the following in the patient's chart:   Medical and social history Use of alcohol, tobacco or illicit drugs  Current medications and supplements including opioid prescriptions. Patient is not currently taking opioid prescriptions. Functional ability and status Nutritional status Physical activity Advanced directives List of other physicians Hospitalizations, surgeries, and ER visits in previous 12 months Vitals Screenings to include cognitive, depression, and falls Referrals and appointments  2. Well woman exam Encourage continued healthy diet and regular activity.   3. Fatigue, unspecified type  - CBC with Differential - Comprehensive Metabolic Panel (CMET) - TSH  4. Elevated LDL cholesterol level  - Lipid Profile  5. Fungal infection of skin  - ketoconazole  (NIZORAL ) 2 % cream; Apply topically daily as needed for irritation.  Dispense: 30 g; Refill: 5  6. Psychophysiological insomnia  - diazepam  (VALIUM ) 10 MG tablet; Take 1 tablet (10 mg total) by mouth at bedtime as needed for sleep.  Dispense: 30 tablet; Refill: 5   I have seen and examined this patient alongside the NP student. I have reviewed and verified the student note and agree with the assessment and plan.  Elveria Quarry, FNP   In  addition, I have reviewed and discussed with patient certain  preventive protocols, quality metrics, and best practice recommendations. A written personalized care plan for preventive services as well as general preventive health recommendations were provided to patient.   Return in about 1 year (around 10/04/2025) for physical.   Elveria JAYSON Quarry, NP   10/04/2024   After Visit Summary: (In Person-Printed) AVS printed and given to the patient

## 2024-10-05 ENCOUNTER — Ambulatory Visit: Payer: Self-pay | Admitting: Nurse Practitioner

## 2024-10-05 ENCOUNTER — Other Ambulatory Visit: Payer: Self-pay | Admitting: Nurse Practitioner

## 2024-10-05 DIAGNOSIS — N1831 Chronic kidney disease, stage 3a: Secondary | ICD-10-CM

## 2024-10-05 DIAGNOSIS — E876 Hypokalemia: Secondary | ICD-10-CM

## 2024-10-05 LAB — COMPREHENSIVE METABOLIC PANEL WITH GFR
ALT: 23 IU/L (ref 0–32)
AST: 30 IU/L (ref 0–40)
Albumin: 4.7 g/dL (ref 3.9–4.9)
Alkaline Phosphatase: 98 IU/L (ref 49–135)
BUN/Creatinine Ratio: 12 (ref 12–28)
BUN: 16 mg/dL (ref 8–27)
Bilirubin Total: 0.9 mg/dL (ref 0.0–1.2)
CO2: 25 mmol/L (ref 20–29)
Calcium: 10.6 mg/dL — ABNORMAL HIGH (ref 8.7–10.3)
Chloride: 98 mmol/L (ref 96–106)
Creatinine, Ser: 1.35 mg/dL — ABNORMAL HIGH (ref 0.57–1.00)
Globulin, Total: 3.2 g/dL (ref 1.5–4.5)
Glucose: 99 mg/dL (ref 70–99)
Potassium: 3.4 mmol/L — ABNORMAL LOW (ref 3.5–5.2)
Sodium: 143 mmol/L (ref 134–144)
Total Protein: 7.9 g/dL (ref 6.0–8.5)
eGFR: 43 mL/min/1.73 — ABNORMAL LOW (ref >59)

## 2024-10-05 LAB — CBC WITH DIFFERENTIAL/PLATELET
Basophils Absolute: 0.1 x10E3/uL (ref 0.0–0.2)
Basos: 1 %
EOS (ABSOLUTE): 0.2 x10E3/uL (ref 0.0–0.4)
Eos: 2 %
Hematocrit: 50 % — ABNORMAL HIGH (ref 34.0–46.6)
Hemoglobin: 16.5 g/dL — ABNORMAL HIGH (ref 11.1–15.9)
Immature Grans (Abs): 0 x10E3/uL (ref 0.0–0.1)
Immature Granulocytes: 0 %
Lymphocytes Absolute: 3.6 x10E3/uL — ABNORMAL HIGH (ref 0.7–3.1)
Lymphs: 36 %
MCH: 29.9 pg (ref 26.6–33.0)
MCHC: 33 g/dL (ref 31.5–35.7)
MCV: 91 fL (ref 79–97)
Monocytes Absolute: 0.7 x10E3/uL (ref 0.1–0.9)
Monocytes: 7 %
Neutrophils Absolute: 5.3 x10E3/uL (ref 1.4–7.0)
Neutrophils: 54 %
Platelets: 387 x10E3/uL (ref 150–450)
RBC: 5.51 x10E6/uL — ABNORMAL HIGH (ref 3.77–5.28)
RDW: 12.8 % (ref 11.7–15.4)
WBC: 9.9 x10E3/uL (ref 3.4–10.8)

## 2024-10-05 LAB — TSH: TSH: 2.23 u[IU]/mL (ref 0.450–4.500)

## 2024-10-05 LAB — LIPID PANEL
Chol/HDL Ratio: 3 ratio (ref 0.0–4.4)
Cholesterol, Total: 142 mg/dL (ref 100–199)
HDL: 48 mg/dL (ref >39)
LDL Chol Calc (NIH): 73 mg/dL (ref 0–99)
Triglycerides: 115 mg/dL (ref 0–149)
VLDL Cholesterol Cal: 21 mg/dL (ref 5–40)

## 2024-10-11 ENCOUNTER — Encounter: Payer: Self-pay | Admitting: Radiology
